# Patient Record
Sex: Male | Born: 1990 | Race: White | Hispanic: No | Marital: Married | Smoking: Never smoker
Health system: Southern US, Community
[De-identification: ages and names within clinical notes are randomized; demographics above are authoritative.]

## PROBLEM LIST (undated history)

## (undated) DIAGNOSIS — G709 Myoneural disorder, unspecified: Secondary | ICD-10-CM

## (undated) DIAGNOSIS — J45909 Unspecified asthma, uncomplicated: Secondary | ICD-10-CM

## (undated) HISTORY — PX: OTHER SURGICAL HISTORY: SHX169

## (undated) HISTORY — DX: Myoneural disorder, unspecified: G70.9

## (undated) HISTORY — PX: BICEPS TENDON REPAIR: SHX566

## (undated) HISTORY — PX: SHOULDER SURGERY: SHX246

## (undated) HISTORY — PX: TONSILLECTOMY: SHX5217

---
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
## 2011-09-19 ENCOUNTER — Emergency Department: Payer: Self-pay | Admitting: Emergency Medicine

## 2011-09-21 ENCOUNTER — Ambulatory Visit: Payer: Self-pay | Admitting: Orthopedic Surgery

## 2013-09-19 ENCOUNTER — Ambulatory Visit: Payer: Self-pay | Admitting: Family Medicine

## 2013-09-26 ENCOUNTER — Ambulatory Visit: Payer: Self-pay | Admitting: Family Medicine

## 2013-12-11 ENCOUNTER — Ambulatory Visit: Payer: Self-pay | Admitting: Physician Assistant

## 2013-12-12 LAB — GC/CHLAMYDIA PROBE AMP

## 2014-01-13 ENCOUNTER — Emergency Department: Payer: Self-pay | Admitting: Internal Medicine

## 2015-01-21 ENCOUNTER — Ambulatory Visit: Payer: BLUE CROSS/BLUE SHIELD

## 2015-01-21 ENCOUNTER — Other Ambulatory Visit: Payer: Self-pay

## 2015-01-21 ENCOUNTER — Encounter: Payer: Self-pay | Admitting: Emergency Medicine

## 2015-01-21 ENCOUNTER — Ambulatory Visit
Admission: EM | Admit: 2015-01-21 | Discharge: 2015-01-21 | Disposition: A | Discharge status: Home / Self Care | Payer: BLUE CROSS/BLUE SHIELD | Attending: Internal Medicine | Admitting: Internal Medicine

## 2015-01-21 DIAGNOSIS — B349 Viral infection, unspecified: Secondary | ICD-10-CM

## 2015-01-21 DIAGNOSIS — R001 Bradycardia, unspecified: Secondary | ICD-10-CM | POA: Insufficient documentation

## 2015-01-21 DIAGNOSIS — R079 Chest pain, unspecified: Secondary | ICD-10-CM | POA: Diagnosis present

## 2015-01-21 HISTORY — DX: Unspecified asthma, uncomplicated: J45.909

## 2015-01-21 MED ORDER — NON FORMULARY
150.0000 mg | Freq: Two times a day (BID) | Status: AC
  Administered 2015-01-21: 150 mg via ORAL

## 2015-01-21 MED ORDER — SALINE SPRAY 0.65 % NA SOLN: 2.0000 | NASAL | Status: DC

## 2015-01-21 MED ORDER — BENZONATATE 200 MG PO CAPS: 200.0000 mg | ORAL_CAPSULE | Freq: Three times a day (TID) | ORAL | Status: DC | PRN

## 2015-01-21 MED ORDER — ALBUTEROL SULFATE HFA 108 (90 BASE) MCG/ACT IN AERS: 1.0000 | INHALATION_SPRAY | RESPIRATORY_TRACT | Status: DC | PRN

## 2015-01-21 MED ORDER — GI COCKTAIL ~~LOC~~
30.0000 mL | Freq: Once | ORAL | Status: AC
  Administered 2015-01-21: 30 mL via ORAL

## 2015-01-21 MED ORDER — NAPROXEN 500 MG PO TABS: 500.0000 mg | ORAL_TABLET | Freq: Two times a day (BID) | ORAL | Status: DC

## 2015-01-21 MED ORDER — FLUTICASONE PROPIONATE 50 MCG/ACT NA SUSP: 1.0000 | Freq: Two times a day (BID) | NASAL | Status: DC

## 2015-01-21 MED ORDER — RANITIDINE HCL 150 MG PO TABS: 150.0000 mg | ORAL_TABLET | Freq: Two times a day (BID) | ORAL | Status: DC

## 2015-01-21 MED ORDER — IPRATROPIUM-ALBUTEROL 0.5-2.5 (3) MG/3ML IN SOLN
3.0000 mL | Freq: Once | RESPIRATORY_TRACT | Status: AC
  Administered 2015-01-21: 3 mL via RESPIRATORY_TRACT

## 2015-01-21 NOTE — ED Notes (Signed)
Patient transported to X-ray 

## 2015-01-21 NOTE — ED Provider Notes (Signed)
CSN: [id number]     Arrival date & time 01/21/15  9562 History   First MD Initiated Contact with Patient 01/21/15 0756     Chief Complaint  Patient presents with  . Chest Pain   (Consider location/radiation/quality/duration/timing/severity/associated sxs/prior Treatment) HPI Comments: Caucasian male awoke 0200 this am with chest pain and headache; denied history of chest pain; noted runny nose, cough, wheezing started Saturday also  Chest pain worsens with cough feels like he should throw up but can't.  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 male presenting with chest pain. The history is provided by the patient.  Chest Pain Pain location:  Epigastric, L chest and R chest Pain quality: burning and tightness   Pain quality: not aching, not crushing, not dull, not hot, no pressure, not radiating, not sharp, not shooting, not stabbing, not tearing and not throbbing   Pain radiates to:  Does not radiate Pain radiates to the back: no   Pain severity:  Moderate Onset quality:  Sudden Duration:  6 hours Timing:  Constant Progression:  Unchanged Chronicity:  New Context: breathing and at rest   Context: no drug use, not eating, no intercourse, not lifting, no movement, not raising an arm, no stress and no trauma   Relieved by:  Nothing Worsened by:  Coughing, exertion, movement and deep breathing Ineffective treatments:  None tried Associated symptoms: cough, headache and nausea   Associated symptoms: no abdominal pain, no AICD problem, no altered mental status, no anorexia, no anxiety, no back pain, no claudication, no diaphoresis, no dizziness, no dysphagia, no fatigue, no fever, no heartburn, no lower extremity edema, no near-syncope, no numbness, no  orthopnea, no palpitations, no PND, no shortness of breath, no syncope, not vomiting and no weakness   Cough:    Cough characteristics:  Non-productive and harsh   Severity:  Moderate   Onset quality:  Sudden   Duration:  3 days   Timing:  Intermittent   Progression:  Unchanged   Chronicity:  New Headaches:    Severity:  Moderate   Onset quality:  Sudden   Duration:  6 hours   Timing:  Constant   Progression:  Unchanged   Chronicity:  New Nausea:    Severity:  Moderate   Onset quality:  Sudden   Duration:  6 hours   Timing:  Constant   Progression:  Unchanged Risk factors: no aortic disease, no birth control, no coronary artery disease, no diabetes mellitus, no Ehlers-Danlos syndrome, no high cholesterol, no hypertension, no immobilization, not male, no Marfan's syndrome, not obese, not pregnant, no prior DVT/PE, no smoking and no surgery     Past Medical History  Diagnosis Date  . Asthma    Past Surgical History  Procedure Laterality Date  . Shoulder surgery Right    History reviewed. No pertinent family history. History  Substance Use Topics  . Smoking status: Never Smoker   . Smokeless tobacco: Former Neurosurgeon    Types: Chew  . Alcohol Use: Yes    Review of Systems  Constitutional: Negative for fever, chills, diaphoresis, activity change, appetite change and fatigue.  HENT: Positive for rhinorrhea and sneezing. Negative for congestion, dental problem, drooling, ear discharge, ear pain, facial swelling, hearing loss, mouth sores,  nosebleeds, postnasal drip, sinus pressure, sore throat, tinnitus, trouble swallowing and voice change.   Eyes: Negative for photophobia, pain, discharge, redness, itching and visual disturbance.  Respiratory: Positive for cough and chest tightness. Negative for choking, shortness of breath, wheezing and stridor.   Cardiovascular: Positive for chest pain. Negative for palpitations, orthopnea, claudication, leg swelling, syncope, PND and  near-syncope.  Gastrointestinal: Positive for nausea. Negative for heartburn, vomiting, abdominal pain, diarrhea, constipation, blood in stool, abdominal distention, anal bleeding, rectal pain and anorexia.  Endocrine: Negative for cold intolerance and heat intolerance.  Genitourinary: Negative for dysuria, hematuria, enuresis, difficulty urinating and genital sores.  Musculoskeletal: Negative for myalgias, back pain, joint swelling, arthralgias, gait problem, neck pain and neck stiffness.  Skin: Negative for color change, pallor, rash and wound.  Allergic/Immunologic: Negative for environmental allergies and food allergies.  Neurological: Positive for headaches. Negative for dizziness, tremors, seizures, syncope, facial asymmetry, speech difficulty, weakness, light-headedness and numbness.  Hematological: Negative for adenopathy. Does not bruise/bleed easily.  Psychiatric/Behavioral: Positive for sleep disturbance. Negative for behavioral problems, confusion and agitation.    Allergies  Review of patient's allergies indicates no known allergies.  Home Medications   Prior to Admission medications   Medication Sig Start Date End Date Taking? Authorizing Provider  albuterol (PROVENTIL HFA;VENTOLIN HFA) 108 (90 BASE) MCG/ACT inhaler Inhale 1-2 puffs into the lungs every 4 (four) hours as needed for wheezing or shortness of breath. 01/21/15   Barbaraann Barthel, NP  benzonatate (TESSALON) 200 MG capsule Take 1 capsule (200 mg total) by mouth 3 (three) times daily as needed for cough. 01/21/15   Barbaraann Barthel, NP  fluticasone (FLONASE) 50 MCG/ACT nasal spray Place 1 spray into both nostrils 2 (two) times daily. 01/21/15   Barbaraann Barthel, NP  naproxen (NAPROSYN) 500 MG tablet Take 1 tablet (500 mg total) by mouth 2 (two) times daily with a meal. 01/21/15   Barbaraann Barthel, NP  ranitidine (ZANTAC) 150 MG tablet Take 1 tablet (150 mg total) by mouth 2 (two) times daily. 01/21/15   Barbaraann Barthel, NP  sodium chloride (OCEAN) 0.65 % SOLN nasal spray Place 2 sprays into both nostrils every 2 (two) hours while awake. 01/21/15   Barbaraann Barthel, NP   BP 116/72 mmHg  Pulse 84  Temp(Src) 96.8 F (36 C) (Tympanic)  Resp 16  Ht 6' (1.829 m)  Wt 190 lb (86.183 kg)  BMI 25.76 kg/m2  SpO2 98% Physical Exam  Constitutional: He is oriented to person, place, and time. Vital signs are normal. He appears well-developed and well-nourished. No distress.  HENT:  Head: Normocephalic and atraumatic.  Right Ear: Hearing, external ear and ear canal normal. Tympanic membrane is scarred. A middle ear effusion is present.  Left Ear: Hearing, external ear and ear canal normal. Tympanic membrane is scarred. A middle ear effusion is present.  Nose: Nose normal.  Mouth/Throat: Uvula is midline. Mucous membranes are not pale, dry and not cyanotic. He does not have dentures. No oral lesions. No trismus in the jaw. Normal dentition. No dental abscesses, uvula swelling, lacerations or dental caries. Posterior oropharyngeal edema and posterior oropharyngeal erythema present. No oropharyngeal exudate or tonsillar abscesses.  Eyes: Conjunctivae, EOM and lids are normal. Pupils are equal, round, and reactive to light. Right eye exhibits no discharge. Left eye exhibits no discharge. No scleral icterus.  Neck: Trachea normal and normal range of motion. Neck supple. No tracheal deviation present. No thyromegaly present.  Cardiovascular: Normal rate,  regular rhythm, normal heart sounds and intact distal pulses.  Exam reveals no gallop and no friction rub.   No murmur heard. Pulmonary/Chest: Effort normal. No accessory muscle usage or stridor. No respiratory distress. He has decreased breath sounds in the right lower field and the left lower field. He has no wheezes. He has no rhonchi. He has no rales. He exhibits no tenderness.  Intermittent nonproductive cough with deep breaths and long sentences  Abdominal:  Soft. Normal appearance, normal aorta and bowel sounds are normal. He exhibits no shifting dullness, no distension, no pulsatile liver, no fluid wave, no abdominal bruit, no ascites, no pulsatile midline mass and no mass. There is no hepatosplenomegaly. There is tenderness in the right lower quadrant and left lower quadrant. There is no rebound, no guarding, no CVA tenderness, no tenderness at McBurney's point and negative Murphy's sign. Hernia confirmed negative in the ventral area.  Dull to percussion x 4 quads  Musculoskeletal: Normal range of motion. He exhibits no edema or tenderness.  Lymphadenopathy:    He has no cervical adenopathy.  Neurological: He is alert and oriented to person, place, and time. He exhibits normal muscle tone. Coordination normal.  Skin: Skin is warm, dry and intact. No rash noted. He is not diaphoretic. No erythema. No pallor.  Psychiatric: He has a normal mood and affect. His speech is normal and behavior is normal. Judgment and thought content normal. Cognition and memory are normal.  Nursing note and vitals reviewed.   ED Course  ED EKG  Date/Time: 01/21/2015 8:23 AM Performed by: Albina Billet A Authorized by: Barbaraann Barthel Interpreted by ED physician Previous ECG: no previous ECG available Rhythm: sinus bradycardia Rate: bradycardic QRS axis: normal ST Segments: ST segments normal T Waves: T waves normal Clinical impression: normal ECG Comments: Vent rate 55 bpm Pr interval QRS duration QT/QTc 408/390 ms PRT axes 25 81 44    (including critical care time) Labs Review Labs Reviewed - No data to display  Imaging Review Dg Chest 2 View  01/21/2015   CLINICAL DATA:  Mid chest pain since this morning.  EXAM: CHEST  2 VIEW  COMPARISON:  04/04/2007  FINDINGS: Normal heart size and mediastinal contours. No acute infiltrate or edema. No effusion or pneumothorax. No acute osseous findings.  IMPRESSION: Negative chest.   Electronically  Signed   By: Marnee Spring M.D.   On: 01/21/2015 09:08   1610  Discussed with patient and father EKG did not show heart attack.  Heart rate was slower than normal (bradycardia)  Patient with frequent nonproductive cough and worsening chest discomfort with cough  Duoneb, GI cocktail ordered  0830 patient reported pain with swallowing resolved and cough resolved after duoneb.  Sp02 99% room air but still having chest discomfort with deep breaths epigastric.  Chest xray discussed and ordered.  Discussed zantac  po with patient and father agreed to trial.  Probable viral illness/inflammation of lining lungs/bronchitis discussed with patient and father.  0940 chest xray results discussed with patient and father.  Given copy of radiology report.  Patient ready to go home  Discomfort improved but not resolved.  Will start naproxen  po BID after picking up from pharmacy this am.  Rest today,  Work excuse not required father is employer.  Hydrate.  Take medications as prescribed  Patient ambulatory without difficulty/dizzyness/SOB/worsening chest pain.  Patient and father verbalized understanding of information/instructions, agreed with plan of care and had no further questions at this  time.    MDM   1. Acute viral syndrome   2. Bradycardia, sinus    Pleurisy, URI, gastroenteritis.  no evidence of invasive bacterial infection, non toxic and well hydrated.  This is most likely self limiting viral infection.  I do not see where any further testing or imaging is necessary at this time.  Chest xray negative effusion/pneumonia/normal discussed with patient and father and given copy of radiology report.  Albuterol 2 puffs po q4-6h prn cough/wheezing.  Use ATC for 48 hours due to inflammation.  Naproxen 500mg  po BID x2 weeks.  Flonase for sinus pressure/rhinitis 1 spray each nostril BID.  Nasal saline 2 sprays each nostril q2h while awake/congestion.  I will suggest supportive care, rest, good hygiene and  encourage the patient to take adequate fluids.  Does not require work excuse.  Notified patient staff will call with culture results once available next 48+ hours.  Sudafed 30mg  po q4-6h prn if rhinitis not controlled with flonase; flonase 1 spray each nostril BID prn, nasal saline 1-2 sprays each nostril prn q2h, tessalon pearles 200mg  po TID prn cough not controlled with inhaler/flonase.  Demonstrated MDI use with patient and father.  Discussed honey with lemon and salt water gargles for comfort also.  The patient is to return to clinic or EMERGENCY ROOM if symptoms worsen or change significantly e.g. fever, lethargy, SOB, wheezing.  Exitcare handout on viral illness, pleurisy, pericarditis, chest pain given to patient.  Patient verbalized agreement and understanding of treatment plan.  I have recommended clear fluids and the BRAT diet.  Zantac 150mg  po BID x 2 weeks.   Medications as directed.  Return to the clinic if  symptoms persist or worsen; I have alerted the patient to call if high fever, unable to urinate every 8 hours, dehydration, marked weakness, fainting, increased abdominal pain, blood in stool or vomit.  Patient verbalized agreement and understanding of treatment plan.   P2:  Hand washing and fitness    Barbaraann Barthelina A Betancourt, NP 01/21/15 1534

## 2015-01-21 NOTE — ED Notes (Signed)
Patient is resting comfortably and father at bedside.

## 2015-01-21 NOTE — Discharge Instructions (Signed)
Bradycardia Bradycardia is a term for a heart rate (pulse) that, in adults, is slower than 60 beats per minute. A normal rate is 60 to 100 beats per minute. A heart rate below 60 beats per minute may be normal for some adults with healthy hearts. If the rate is too slow, the heart may have trouble pumping the volume of blood the body needs. If the heart rate gets too low, blood flow to the brain may be decreased and may make you feel lightheaded, dizzy, or faint. The heart has a natural pacemaker in the top of the heart called the SA node (sinoatrial or sinus node). This pacemaker sends out regular electrical signals to the muscle of the heart, telling the heart muscle when to beat (contract). The electrical signal travels from the upper parts of the heart (atria) through the AV node (atrioventricular node), to the lower chambers of the heart (ventricles). The ventricles squeeze, pumping the blood from your heart to your lungs and to the rest of your body. CAUSES  Problem with the heart's electrical system. Problem with the heart's natural pacemaker. Heart disease, damage, or infection. Medications. Problems with minerals and salts (electrolytes). SYMPTOMS  Fainting (syncope). Fatigue and weakness. Shortness of breath (dyspnea). Chest pain (angina). Drowsiness. Confusion. DIAGNOSIS  An electrocardiogram (ECG) can help your caregiver determine the type of slow heart rate you have. If the cause is not seen on an ECG, you may need to wear a heart monitor that records your heart rhythm for several hours or days. Blood tests. TREATMENT  Electrolyte supplements. Medications. Withholding medication which is causing a slow heart rate. Pacemaker placement. SEEK IMMEDIATE MEDICAL CARE IF:  You feel lightheaded or faint. You develop an irregular heart rate. You feel chest pain or have trouble breathing. MAKE SURE YOU:  Understand these instructions. Will watch your condition. Will get help right  away if you are not doing well or get worse. Document Released: 03/14/2002 Document Revised: 09/14/2011 Document Reviewed: 09/27/2013 [facility] Patient Information 2015 [place], [place]. This information is not intended to replace advice given to you by your health care provider. Make sure you discuss any questions you have with your health care provider. Chest Pain (Nonspecific) It is often hard to give a specific diagnosis for the cause of chest pain. There is always a chance that your pain could be related to something serious, such as a heart attack or a blood clot in the lungs. You need to follow up with your health care provider for further evaluation. CAUSES  Heartburn. Pneumonia or bronchitis. Anxiety or stress. Inflammation around your heart (pericarditis) or lung (pleuritis or pleurisy). A blood clot in the lung. A collapsed lung (pneumothorax). It can develop suddenly on its own (spontaneous pneumothorax) or from trauma to the chest. Shingles infection (herpes zoster virus). The chest wall is composed of bones, muscles, and cartilage. Any of these can be the source of the pain. The bones can be bruised by injury. The muscles or cartilage can be strained by coughing or overwork. The cartilage can be affected by inflammation and become sore (costochondritis). DIAGNOSIS  Lab tests or other studies may be needed to find the cause of your pain. Your health care provider may have you take a test called an ambulatory electrocardiogram (ECG). An ECG records your heartbeat patterns over a 24-hour period. You may also have other tests, such as: Transthoracic echocardiogram (TTE). During echocardiography, sound waves are used to evaluate how blood flows through your heart. Transesophageal echocardiogram (  TEE). Cardiac monitoring. This allows your health care provider to monitor your heart rate and rhythm in real time. Holter monitor. This is a portable device that records your heartbeat and can  help diagnose heart arrhythmias. It allows your health care provider to track your heart activity for several days, if needed. Stress tests by exercise or by giving medicine that makes the heart beat faster. TREATMENT  Treatment depends on what may be causing your chest pain. Treatment may include: Acid blockers for heartburn. Anti-inflammatory medicine. Pain medicine for inflammatory conditions. Antibiotics if an infection is present. You may be advised to change lifestyle habits. This includes stopping smoking and avoiding alcohol, caffeine, and chocolate. You may be advised to keep your head raised (elevated) when sleeping. This reduces the chance of acid going backward from your stomach into your esophagus. Most of the time, nonspecific chest pain will improve within 2-3 days with rest and mild pain medicine.  HOME CARE INSTRUCTIONS  If antibiotics were prescribed, take them as directed. Finish them even if you start to feel better. For the next few days, avoid physical activities that bring on chest pain. Continue physical activities as directed. Do not use any tobacco products, including cigarettes, chewing tobacco, or electronic cigarettes. Avoid drinking alcohol. Only take medicine as directed by your health care provider. Follow your health care provider's suggestions for further testing if your chest pain does not go away. Keep any follow-up appointments you made. If you do not go to an appointment, you could develop lasting (chronic) problems with pain. If there is any problem keeping an appointment, call to reschedule. SEEK MEDICAL CARE IF:  Your chest pain does not go away, even after treatment. You have a rash with blisters on your chest. You have a fever. SEEK IMMEDIATE MEDICAL CARE IF:  You have increased chest pain or pain that spreads to your arm, neck, jaw, back, or abdomen. You have shortness of breath. You have an increasing cough, or you cough up blood. You have  severe back or abdominal pain. You feel nauseous or vomit. You have severe weakness. You faint. You have chills. This is an emergency. Do not wait to see if the pain will go away. Get medical help at once. Call your local emergency services (911 in [place] Do not drive yourself to the hospital. MAKE SURE YOU:  Understand these instructions. Will watch your condition. Will get help right away if you are not doing well or get worse. Document Released: 04/01/2005 Document Revised: 06/27/2013 Document Reviewed: 01/26/2008 [facility] Patient Information 2015 [place], [place]. This information is not intended to replace advice given to you by your health care provider. Make sure you discuss any questions you have with your health care provider. Viral Infections A virus is a type of germ. Viruses can cause: Minor sore throats. Aches and pains. Headaches. Runny nose. Rashes. Watery eyes. Tiredness. Coughs. Loss of appetite. Feeling sick to your stomach (nausea). Throwing up (vomiting). Watery poop (diarrhea). HOME CARE  Only take medicines as told by your doctor. Drink enough water and fluids to keep your pee (urine) clear or pale yellow. Sports drinks are a good choice. Get plenty of rest and eat healthy. Soups and broths with crackers or rice are fine. GET HELP RIGHT AWAY IF:  You have a very bad headache. You have shortness of breath. You have chest pain or neck pain. You have an unusual rash. You cannot stop throwing up. You have watery poop that does not stop. You  cannot keep fluids down. You or your child has a temperature by mouth above 102 F (38.9 C), not controlled by medicine. Your baby is older than 3 months with a rectal temperature of 102 F (38.9 C) or higher. Your baby is 343 months old or younger with a rectal temperature of 100.4 F (38 C) or higher. MAKE SURE YOU:  Understand these instructions. Will watch this condition. Will get help right away if you are not  doing well or get worse. Document Released: 06/04/2008 Document Revised: 09/14/2011 Document Reviewed: 10/28/2010 [facility] Patient [facility] 2015 [place]. This information is not intended to replace advice given to you by your health care provider. Make sure you discuss any questions you have with your health care provider.  Acute Bronchitis Bronchitis is inflammation of the airways that extend from the windpipe into the lungs (bronchi). The inflammation often causes mucus to develop. This leads to a cough, which is the most common symptom of bronchitis.  In acute bronchitis, the condition usually develops suddenly and goes away over time, usually in a couple weeks. Smoking, allergies, and asthma can make bronchitis worse. Repeated episodes of bronchitis may cause further lung problems.  CAUSES Acute bronchitis is most often caused by the same virus that causes a cold. The virus can spread from person to person (contagious) through coughing, sneezing, and touching contaminated objects. SIGNS AND SYMPTOMS   Cough.   Fever.   Coughing up mucus.   Body aches.   Chest congestion.   Chills.   Shortness of breath.   Sore throat.  DIAGNOSIS  Acute bronchitis is usually diagnosed through a physical exam. Your health care provider will also ask you questions about your medical history. Tests, such as chest X-rays, are sometimes done to rule out other conditions.  TREATMENT  Acute bronchitis usually goes away in a couple weeks. Oftentimes, no medical treatment is necessary. Medicines are sometimes given for relief of fever or cough. Antibiotic medicines are usually not needed but may be prescribed in certain situations. In some cases, an inhaler may be recommended to help reduce shortness of breath and control the cough. A cool mist vaporizer may also be used to help thin bronchial secretions and make it easier to clear the chest.  HOME CARE INSTRUCTIONS  Get plenty of rest.    Drink enough fluids to keep your urine clear or pale yellow (unless you have a medical condition that requires fluid restriction). Increasing fluids may help thin your respiratory secretions (sputum) and reduce chest congestion, and it will prevent dehydration.   Take medicines only as directed by your health care provider.  If you were prescribed an antibiotic medicine, finish it all even if you start to feel better.  Avoid smoking and secondhand smoke. Exposure to cigarette smoke or irritating chemicals will make bronchitis worse. If you are a smoker, consider using nicotine gum or skin patches to help control withdrawal symptoms. Quitting smoking will help your lungs heal faster.   Reduce the chances of another bout of acute bronchitis by washing your hands frequently, avoiding people with cold symptoms, and trying not to touch your hands to your mouth, nose, or eyes.   Keep all follow-up visits as directed by your health care provider.  SEEK MEDICAL CARE IF: Your symptoms do not improve after 1 week of treatment.  SEEK IMMEDIATE MEDICAL CARE IF:  You develop an increased fever or chills.   You have chest pain.   You have severe shortness of breath.  You have bloody sputum.   You develop dehydration.  You faint or repeatedly feel like you are going to pass out.  You develop repeated vomiting.  You develop a severe headache. MAKE SURE YOU:   Understand these instructions.  Will watch your condition.  Will get help right away if you are not doing well or get worse. Document Released: 07/30/2004 Document Revised: 11/06/2013 Document Reviewed: 12/13/2012 [facility] Patient Information 2015 [place], [place]. This information is not intended to replace advice given to you by your health care provider. Make sure you discuss any questions you have with your health care provider. Cough, Adult  A cough is a reflex that helps clear your throat and airways. It can help heal  the body or may be a reaction to an irritated airway. A cough may only last 2 or 3 weeks (acute) or may last more than 8 weeks (chronic).  CAUSES Acute cough:  Viral or bacterial infections. Chronic cough:  Infections.  Allergies.  Asthma.  Post-nasal drip.  Smoking.  Heartburn or acid reflux.  Some medicines.  Chronic lung problems (COPD).  Cancer. SYMPTOMS   Cough.  Fever.  Chest pain.  Increased breathing rate.  High-pitched whistling sound when breathing (wheezing).  Colored mucus that you cough up (sputum). TREATMENT   A bacterial cough may be treated with antibiotic medicine.  A viral cough must run its course and will not respond to antibiotics.  Your caregiver may recommend other treatments if you have a chronic cough. HOME CARE INSTRUCTIONS   Only take over-the-counter or prescription medicines for pain, discomfort, or fever as directed by your caregiver. Use cough suppressants only as directed by your caregiver.  Use a cold steam vaporizer or humidifier in your bedroom or home to help loosen secretions.  Sleep in a semi-upright position if your cough is worse at night.  Rest as needed.  Stop smoking if you smoke. SEEK IMMEDIATE MEDICAL CARE IF:   You have pus in your sputum.  Your cough starts to worsen.  You cannot control your cough with suppressants and are losing sleep.  You begin coughing up blood.  You have difficulty breathing.  You develop pain which is getting worse or is uncontrolled with medicine.  You have a fever. MAKE SURE YOU:   Understand these instructions.  Will watch your condition.  Will get help right away if you are not doing well or get worse. Document Released: 12/19/2010 Document Revised: 09/14/2011 Document Reviewed: 12/19/2010 [facility] Patient Information 2015 [place], [place]. This information is not intended to replace advice given to you by your health care provider. Make sure you discuss any  questions you have with your health care provider. Gastroesophageal Reflux Disease, Adult Gastroesophageal reflux disease (GERD) happens when acid from your stomach flows up into the esophagus. When acid comes in contact with the esophagus, the acid causes soreness (inflammation) in the esophagus. Over time, GERD may create small holes (ulcers) in the lining of the esophagus. CAUSES   Increased body weight. This puts pressure on the stomach, making acid rise from the stomach into the esophagus.  Smoking. This increases acid production in the stomach.  Drinking alcohol. This causes decreased pressure in the lower esophageal sphincter (valve or ring of muscle between the esophagus and stomach), allowing acid from the stomach into the esophagus.  Late evening meals and a full stomach. This increases pressure and acid production in the stomach.  A malformed lower esophageal sphincter. Sometimes, no cause is found. SYMPTOMS  Burning pain in the lower part of the mid-chest behind the breastbone and in the mid-stomach area. This may occur twice a week or more often.  Trouble swallowing.  Sore throat.  Dry cough.  Asthma-like symptoms including chest tightness, shortness of breath, or wheezing. DIAGNOSIS  Your caregiver may be able to diagnose GERD based on your symptoms. In some cases, X-rays and other tests may be done to check for complications or to check the condition of your stomach and esophagus. TREATMENT  Your caregiver may recommend over-the-counter or prescription medicines to help decrease acid production. Ask your caregiver before starting or adding any new medicines.  HOME CARE INSTRUCTIONS   Change the factors that you can control. Ask your caregiver for guidance concerning weight loss, quitting smoking, and alcohol consumption.  Avoid foods and drinks that make your symptoms worse, such as:  Caffeine or alcoholic drinks.  Chocolate.  Peppermint or mint  flavorings.  Garlic and onions.  Spicy foods.  Citrus fruits, such as oranges, lemons, or limes.  Tomato-based foods such as sauce, chili, salsa, and pizza.  Fried and fatty foods.  Avoid lying down for the 3 hours prior to your bedtime or prior to taking a nap.  Eat small, frequent meals instead of large meals.  Wear loose-fitting clothing. Do not wear anything tight around your waist that causes pressure on your stomach.  Raise the head of your bed 6 to 8 inches with wood blocks to help you sleep. Extra pillows will not help.  Only take over-the-counter or prescription medicines for pain, discomfort, or fever as directed by your caregiver.  Do not take aspirin, ibuprofen, or other nonsteroidal anti-inflammatory drugs (NSAIDs). SEEK IMMEDIATE MEDICAL CARE IF:   You have pain in your arms, neck, jaw, teeth, or back.  Your pain increases or changes in intensity or duration.  You develop nausea, vomiting, or sweating (diaphoresis).  You develop shortness of breath, or you faint.  Your vomit is green, yellow, black, or looks like coffee grounds or blood.  Your stool is red, bloody, or black. These symptoms could be signs of other problems, such as heart disease, gastric bleeding, or esophageal bleeding. MAKE SURE YOU:   Understand these instructions.  Will watch your condition.  Will get help right away if you are not doing well or get worse. Document Released: 04/01/2005 Document Revised: 09/14/2011 Document Reviewed: 01/09/2011 [facility] Patient Information 2015 [place], [place]. This information is not intended to replace advice given to you by your health care provider. Make sure you discuss any questions you have with your health care provider. Sinusitis Sinusitis is redness, soreness, and inflammation of the paranasal sinuses. Paranasal sinuses are air pockets within the bones of your face (beneath the eyes, the middle of the forehead, or above the eyes). In healthy  paranasal sinuses, mucus is able to drain out, and air is able to circulate through them by way of your nose. However, when your paranasal sinuses are inflamed, mucus and air can become trapped. This can allow bacteria and other germs to grow and cause infection. Sinusitis can develop quickly and last only a short time (acute) or continue over a long period (chronic). Sinusitis that lasts for more than 12 weeks is considered chronic.  CAUSES  Causes of sinusitis include:  Allergies.  Structural abnormalities, such as displacement of the cartilage that separates your nostrils (deviated septum), which can decrease the air flow through your nose and sinuses and affect sinus drainage.  Functional abnormalities, such as  when the small hairs (cilia) that line your sinuses and help remove mucus do not work properly or are not present. SIGNS AND SYMPTOMS  Symptoms of acute and chronic sinusitis are the same. The primary symptoms are pain and pressure around the affected sinuses. Other symptoms include:  Upper toothache.  Earache.  Headache.  Bad breath.  Decreased sense of smell and taste.  A cough, which worsens when you are lying flat.  Fatigue.  Fever.  Thick drainage from your nose, which often is green and may contain pus (purulent).  Swelling and warmth over the affected sinuses. DIAGNOSIS  Your health care provider will perform a physical exam. During the exam, your health care provider may:  Look in your nose for signs of abnormal growths in your nostrils (nasal polyps).  Tap over the affected sinus to check for signs of infection.  View the inside of your sinuses (endoscopy) using an imaging device that has a light attached (endoscope). If your health care provider suspects that you have chronic sinusitis, one or more of the following tests may be recommended:  Allergy tests.  Nasal culture. A sample of mucus is taken from your nose, sent to a lab, and screened for  bacteria.  Nasal cytology. A sample of mucus is taken from your nose and examined by your health care provider to determine if your sinusitis is related to an allergy. TREATMENT  Most cases of acute sinusitis are related to a viral infection and will resolve on their own within 10 days. Sometimes medicines are prescribed to help relieve symptoms (pain medicine, decongestants, nasal steroid sprays, or saline sprays).  However, for sinusitis related to a bacterial infection, your health care provider will prescribe antibiotic medicines. These are medicines that will help kill the bacteria causing the infection.  Rarely, sinusitis is caused by a fungal infection. In theses cases, your health care provider will prescribe antifungal medicine. For some cases of chronic sinusitis, surgery is needed. Generally, these are cases in which sinusitis recurs more than 3 times per year, despite other treatments. HOME CARE INSTRUCTIONS   Drink plenty of water. Water helps thin the mucus so your sinuses can drain more easily.  Use a humidifier.  Inhale steam 3 to 4 times a day (for example, sit in the bathroom with the shower running).  Apply a warm, moist washcloth to your face 3 to 4 times a day, or as directed by your health care provider.  Use saline nasal sprays to help moisten and clean your sinuses.  Take medicines only as directed by your health care provider.  If you were prescribed either an antibiotic or antifungal medicine, finish it all even if you start to feel better. SEEK IMMEDIATE MEDICAL CARE IF:  You have increasing pain or severe headaches.  You have nausea, vomiting, or drowsiness.  You have swelling around your face.  You have vision problems.  You have a stiff neck.  You have difficulty breathing. MAKE SURE YOU:   Understand these instructions.  Will watch your condition.  Will get help right away if you are not doing well or get worse. Document Released: 06/22/2005  Document Revised: 11/06/2013 Document Reviewed: 07/07/2011 [facility] Patient Information 2015 [place], [place]. This information is not intended to replace advice given to you by your health care provider. Make sure you discuss any questions you have with your health care provider. Viral Gastroenteritis Viral gastroenteritis is also known as stomach flu. This condition affects the stomach and intestinal tract. It  can cause sudden diarrhea and vomiting. The illness typically lasts 3 to 8 days. Most people develop an immune response that eventually gets rid of the virus. While this natural response develops, the virus can make you quite ill. CAUSES  Many different viruses can cause gastroenteritis, such as rotavirus or noroviruses. You can catch one of these viruses by consuming contaminated food or water. You may also catch a virus by sharing utensils or other personal items with an infected person or by touching a contaminated surface. SYMPTOMS  The most common symptoms are diarrhea and vomiting. These problems can cause a severe loss of body fluids (dehydration) and a body salt (electrolyte) imbalance. Other symptoms may include:  Fever.  Headache.  Fatigue.  Abdominal pain. DIAGNOSIS  Your caregiver can usually diagnose viral gastroenteritis based on your symptoms and a physical exam. A stool sample may also be taken to test for the presence of viruses or other infections. TREATMENT  This illness typically goes away on its own. Treatments are aimed at rehydration. The most serious cases of viral gastroenteritis involve vomiting so severely that you are not able to keep fluids down. In these cases, fluids must be given through an intravenous line (IV). HOME CARE INSTRUCTIONS   Drink enough fluids to keep your urine clear or pale yellow. Drink small amounts of fluids frequently and increase the amounts as tolerated.  Ask your caregiver for specific rehydration instructions.  Avoid:  Foods  high in sugar.  Alcohol.  Carbonated drinks.  Tobacco.  Juice.  Caffeine drinks.  Extremely hot or cold fluids.  Fatty, greasy foods.  Too much intake of anything at one time.  Dairy products until 24 to 48 hours after diarrhea stops.  You may consume probiotics. Probiotics are active cultures of beneficial bacteria. They may lessen the amount and number of diarrheal stools in adults. Probiotics can be found in yogurt with active cultures and in supplements.  Wash your hands well to avoid spreading the virus.  Only take over-the-counter or prescription medicines for pain, discomfort, or fever as directed by your caregiver. Do not give aspirin to children. Antidiarrheal medicines are not recommended.  Ask your caregiver if you should continue to take your regular prescribed and over-the-counter medicines.  Keep all follow-up appointments as directed by your caregiver. SEEK IMMEDIATE MEDICAL CARE IF:   You are unable to keep fluids down.  You do not urinate at least once every 6 to 8 hours.  You develop shortness of breath.  You notice blood in your stool or vomit. This may look like coffee grounds.  You have abdominal pain that increases or is concentrated in one small area (localized).  You have persistent vomiting or diarrhea.  You have a fever.  The patient is a child younger than 3 months, and he or she has a fever.  The patient is a child older than 3 months, and he or she has a fever and persistent symptoms.  The patient is a child older than 3 months, and he or she has a fever and symptoms suddenly get worse.  The patient is a baby, and he or she has no tears when crying. MAKE SURE YOU:   Understand these instructions.  Will watch your condition.  Will get help right away if you are not doing well or get worse. Document Released: 06/22/2005 Document Revised: 09/14/2011 Document Reviewed: 04/08/2011 [facility] Patient Information 2015 [place], [place].  This information is not intended to replace advice given to you by  your health care provider. Make sure you discuss any questions you have with your health care provider. Pericarditis Pericarditis is swelling (inflammation) of the pericardium. The pericardium is a thin, double-layered, fluid-filled tissue sac that surrounds the heart. The purpose of the pericardium is to contain the heart in the chest cavity and keep the heart from overexpanding. Different types of pericarditis can occur, such as:  Acute pericarditis. Inflammation can develop suddenly in acute pericarditis.  Chronic pericarditis. Inflammation develops gradually and is long-lasting in chronic pericarditis.  Constrictive pericarditis. In this type of pericarditis, the layers of the pericardium stiffen and develop scar tissue. The scar tissue thickens and sticks together. This makes it difficult for the heart to pump and work as it normally does. CAUSES  Pericarditis can be caused from different conditions, such as:  A bacterial, fungal or viral infection.  After a heart attack (myocardial infarction).  After open-heart surgery (coronary bypass graft surgery).  Auto-immune conditions such as lupus, rheumatoid arthritis or scleroderma.  Kidney failure.  Low thyroid condition (hypothyroidism).  Cancer from another part of the body that has spread (metastasized) to the pericardium.  Chest injury or trauma.  After radiation treatment.  Certain medicines. SYMPTOMS  Symptoms of pericarditis can include:  Chest pain. Chest pain symptoms may increase when laying down and may be relieved when sitting up and leaning forward.  A chronic, dry cough.  Heart palpitations. These may feel like rapid, fluttering or pounding heart beats.  Chest pain may be worse when swallowing.  Dizziness or fainting.  Tiredness, fatigue or lethargy.  Fever. DIAGNOSIS  Pericarditis is diagnosed by the following:  A physical exam. A  heart sound called a pericardial friction rub may be heard when your caregiver listens to your heart.  Blood work. Blood may be drawn to check for an infection and to look at your blood chemistry.  Electrocardiography. During electrocardiography your heart's electrical activity is monitored and recorded with a tracing on paper (electrocardiogram [ECG]).  Echocardiography.  Computed tomography (CT).  Magnetic resonance image (MRI). TREATMENT  To treat pericarditis, it is important to know the cause of it. The cause of pericarditis determines the treatment.   If the cause of pericarditis is due to an infection, treatment is based on the type of infection. If an infection is suspected in the pericardial fluid, a procedure called a pericardial fluid culture and biopsy may be done. This takes a sample of the pericardial fluid. The sample is sent to a lab which runs tests on the pericardial fluid to check for an infection.  If the autoimmune disease is the cause, treatment of the autoimmune condition will help improve the pericarditis.  If the cause of pericarditis is not known, anti-inflammatory medicines may be used to help decrease the inflammation.  Surgery may be needed. The following are types of surgeries or procedures that may be done to treat pericarditis:  Pericardial window. A pericardial window makes a cut (incision) into the pericardial sac. This allows excess fluid in the pericardium to drain.  Pericardiocentesis. A pericardiocentesis is also known as a pericardial tap. This procedure uses a needle that is guided by X-ray to drain (aspirate) excess fluid from the pericardium.  Pericardiectomy. A pericardiectomy removes part or all of the pericardium. HOME CARE INSTRUCTIONS   Do not smoke. If you smoke, quit. Your caregiver can help you quit smoking.  Maintain a healthy weight.  Follow an exercise program as told by your caregiver.  If you drink alcohol, do  so in  moderation.  Eat a heart healthy diet. A registered dietician can help you learn about healthy food choices.  Keep a list of all your medicines with you at all times. Include the name, dose, how often it is taken and how it is taken. SEEK IMMEDIATE MEDICAL CARE IF:   You have chest pain or feelings of chest pressure.  You have sweating (diaphoresis) when at rest.  You have irregular heartbeats (palpitations).  You have rapid, racing heart beats.  You have unexplained fainting episodes.  You feel sick to your stomach (nausea) or vomiting without cause.  You have unexplained weakness. If you develop any of the symptoms which originally made you seek care, call for local emergency medical help. Do not drive yourself to the hospital. Document Released: 12/16/2000 Document Revised: 09/14/2011 Document Reviewed: 06/24/2011 [facility] Patient Information 2015 [place], [place]. This information is not intended to replace advice given to you by your health care provider. Make sure you discuss any questions you have with your health care provider. Pleurisy Pleurisy is an inflammation and swelling of the lining of the lungs (pleura). Because of this inflammation, it hurts to breathe. It can be aggravated by coughing, laughing, or deep breathing. Pleurisy is often caused by an underlying infection or disease.  HOME CARE INSTRUCTIONS  Monitor your pleurisy for any changes. The following actions may help to alleviate any discomfort you are experiencing:  Medicine may help with pain. Only take over-the-counter or prescription medicines for pain, discomfort, or fever as directed by your health care provider.  Only take antibiotic medicine as directed. Make sure to finish it even if you start to feel better. SEEK MEDICAL CARE IF:   Your pain is not controlled with medicine or is increasing.  You have an increase in pus-like (purulent) secretions brought up with coughing. SEEK IMMEDIATE MEDICAL CARE  IF:   You have blue or dark lips, fingernails, or toenails.  You are coughing up blood.  You have increased difficulty breathing.  You have continuing pain unrelieved by medicine or pain lasting more than 1 week.  You have pain that radiates into your neck, arms, or jaw.  You develop increased shortness of breath or wheezing.  You develop a fever, rash, vomiting, fainting, or other serious symptoms. MAKE SURE YOU:  Understand these instructions.   Will watch your condition.   Will get help right away if you are not doing well or get worse.  Document Released: 06/22/2005 Document Revised: 02/22/2013 Document Reviewed: 12/04/2012 [facility] Patient Information 2015 [place], [place]. This information is not intended to replace advice given to you by your health care provider. Make sure you discuss any questions you have with your health care provider.

## 2015-01-21 NOTE — ED Notes (Signed)
Patient c/o SOB and chest pain that started at 2:00am this morning.  Patient denies vomiting.

## 2015-09-24 ENCOUNTER — Encounter: Payer: Self-pay | Admitting: Family Medicine

## 2015-09-24 ENCOUNTER — Ambulatory Visit (INDEPENDENT_AMBULATORY_CARE_PROVIDER_SITE_OTHER): Payer: BLUE CROSS/BLUE SHIELD | Admitting: Family Medicine

## 2015-09-24 VITALS — BP 120/80 | HR 78 | Ht 74.0 in | Wt 173.0 lb

## 2015-09-24 DIAGNOSIS — R634 Abnormal weight loss: Secondary | ICD-10-CM

## 2015-09-24 DIAGNOSIS — B009 Herpesviral infection, unspecified: Secondary | ICD-10-CM | POA: Diagnosis not present

## 2015-09-24 LAB — GLUCOSE, POCT (MANUAL RESULT ENTRY): POC GLUCOSE: 85 mg/dL (ref 70–99)

## 2015-09-24 MED ORDER — VALACYCLOVIR HCL 500 MG PO TABS: 500.0000 mg | ORAL_TABLET | Freq: Two times a day (BID) | ORAL | Status: DC

## 2015-09-24 MED ORDER — VALACYCLOVIR HCL 500 MG PO TABS: 500.0000 mg | ORAL_TABLET | Freq: Every day | ORAL | Status: DC

## 2015-09-24 NOTE — Progress Notes (Signed)
Name: Shaun Gibbs   MRN: [id number]    DOB: [birth year]   Date:09/24/2015       Progress Note  Subjective  Chief Complaint  Chief Complaint  Patient presents with  . Establish Care  . Rash    around penis- has had herpes in past    Rash This is a recurrent problem. The current episode started today. The problem has been waxing and waning since onset. The affected locations include the genitalia. The rash is characterized by redness (vesicles). Pertinent negatives include no anorexia, congestion, cough, diarrhea, eye pain, facial edema, fatigue, fever, joint pain, nail changes, rhinorrhea, shortness of breath, sore throat or vomiting. The treatment provided no relief.    No problem-specific assessment & plan notes found for this encounter.   Past Medical History  Diagnosis Date  . Asthma     Past Surgical History  Procedure Laterality Date  . Biceps tendon repair Right   . Thumb tendon      repair  . Tonsillectomy    . Nose fracture repair      History reviewed. No pertinent family history.  Social History   Social History  . Marital Status: Unknown    Spouse Name: N/A  . Number of Children: N/A  . Years of Education: N/A   Occupational History  . Not on file.   Social History Main Topics  . Smoking status: Never Smoker   . Smokeless tobacco: Current User    Types: Snuff  . Alcohol Use: 0.0 oz/week    0 Standard drinks or equivalent per week  . Drug Use: No  . Sexual Activity: Yes   Other Topics Concern  . Not on file   Social History Narrative  . No narrative on file    No Known Allergies   Review of Systems  Constitutional: Negative for fever, chills, weight loss, malaise/fatigue and fatigue.  HENT: Negative for congestion, ear discharge, ear pain, rhinorrhea and sore throat.   Eyes: Negative for blurred vision and pain.  Respiratory: Negative for cough, sputum production, shortness of breath and wheezing.   Cardiovascular: Negative for  chest pain, palpitations and leg swelling.  Gastrointestinal: Negative for heartburn, nausea, vomiting, abdominal pain, diarrhea, constipation, blood in stool, melena and anorexia.  Genitourinary: Negative for dysuria, urgency, frequency and hematuria.  Musculoskeletal: Negative for myalgias, back pain, joint pain and neck pain.  Skin: Positive for rash. Negative for nail changes.  Neurological: Negative for dizziness, tingling, sensory change, focal weakness and headaches.  Endo/Heme/Allergies: Negative for environmental allergies and polydipsia. Does not bruise/bleed easily.  Psychiatric/Behavioral: Negative for depression and suicidal ideas. The patient is not nervous/anxious and does not have insomnia.      Objective  Filed Vitals:   09/24/15 1355  BP: 120/80  Pulse: 78  Height:  (1.88 m)  Weight: 173 lb (78.472 kg)    Physical Exam  Constitutional: He is oriented to person, place, and time and well-developed, well-nourished, and in no distress.  HENT:  Head: Normocephalic.  Right Ear: External ear normal.  Left Ear: External ear normal.  Nose: Nose normal.  Mouth/Throat: Oropharynx is clear and moist.  Eyes: Conjunctivae and EOM are normal. Pupils are equal, round, and reactive to light. Right eye exhibits no discharge. Left eye exhibits no discharge. No scleral icterus.  Neck: Normal range of motion. Neck supple. No JVD present. No tracheal deviation present. No thyromegaly present.  Cardiovascular: Normal rate, regular rhythm, normal heart sounds and intact distal pulses.  Exam reveals no gallop and no friction rub.   No murmur heard. Pulmonary/Chest: Breath sounds normal. No respiratory distress. He has no wheezes. He has no rales.  Abdominal: Soft. Bowel sounds are normal. He exhibits no mass. There is no hepatosplenomegaly. There is no tenderness. There is no rebound, no guarding and no CVA tenderness.  Genitourinary: He exhibits testicular tenderness. He exhibits no  abnormal testicular mass, no abnormal scrotal mass and no scrotal tenderness. Penis exhibits lesions.  Musculoskeletal: Normal range of motion. He exhibits no edema or tenderness.  Lymphadenopathy:    He has no cervical adenopathy.  Neurological: He is alert and oriented to person, place, and time. He has normal sensation, normal strength and intact cranial nerves. No cranial nerve deficit.  Skin: Skin is warm. No rash noted.  Psychiatric: Mood and affect normal.      Assessment & Plan  Problem List Items Addressed This Visit    None    Visit Diagnoses    Herpes simplex    -  Primary    Relevant Medications    valACYclovir (VALTREX) 500 MG tablet    valACYclovir (VALTREX) 500 MG tablet    Weight loss, unintentional        Relevant Orders    POCT Glucose (CBG) (Completed)         Dr. Hayden Rasmusseneanna Josmar Messimer [facility]  09/24/2015

## 2016-03-24 IMAGING — CR DG CHEST 2V
2 series · 2 of 2 positions shown · non-contrast
Comparison: 04/04/2007

CLINICAL DATA: Mid chest pain since this morning.

EXAM:
CHEST  2 VIEW

[chest pa]
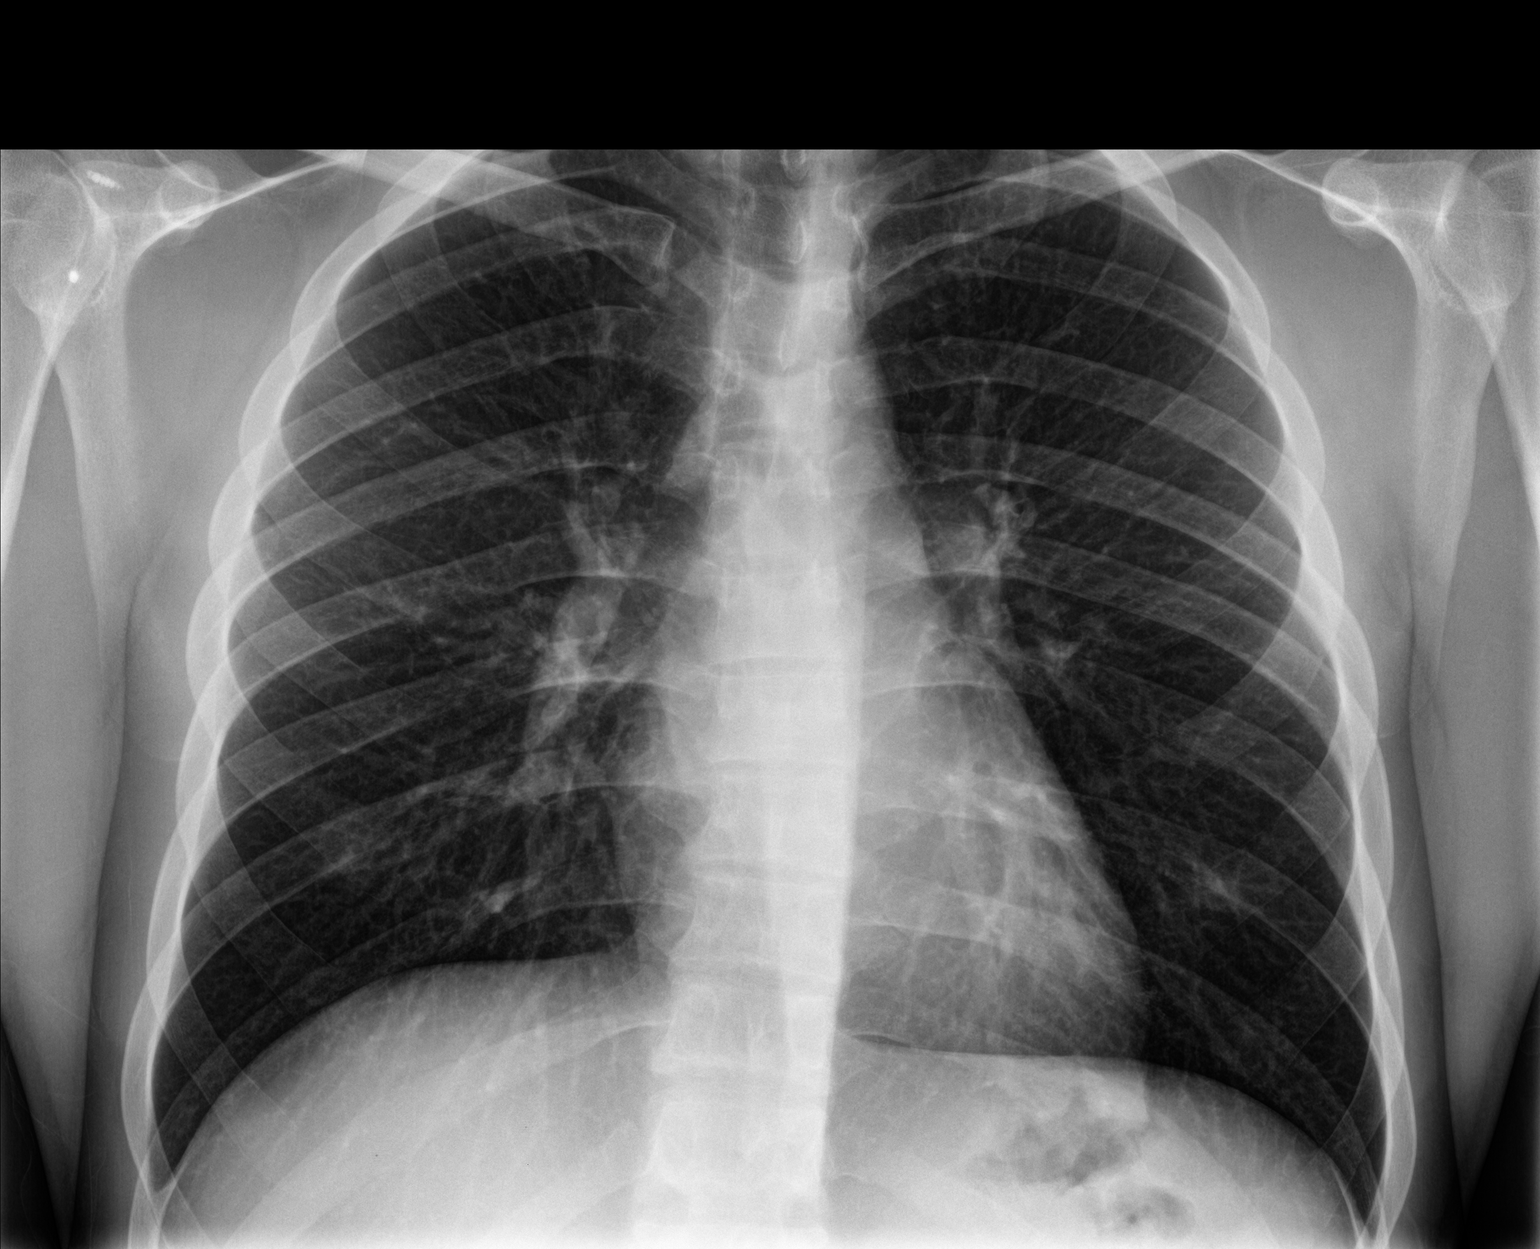

[chest lat]
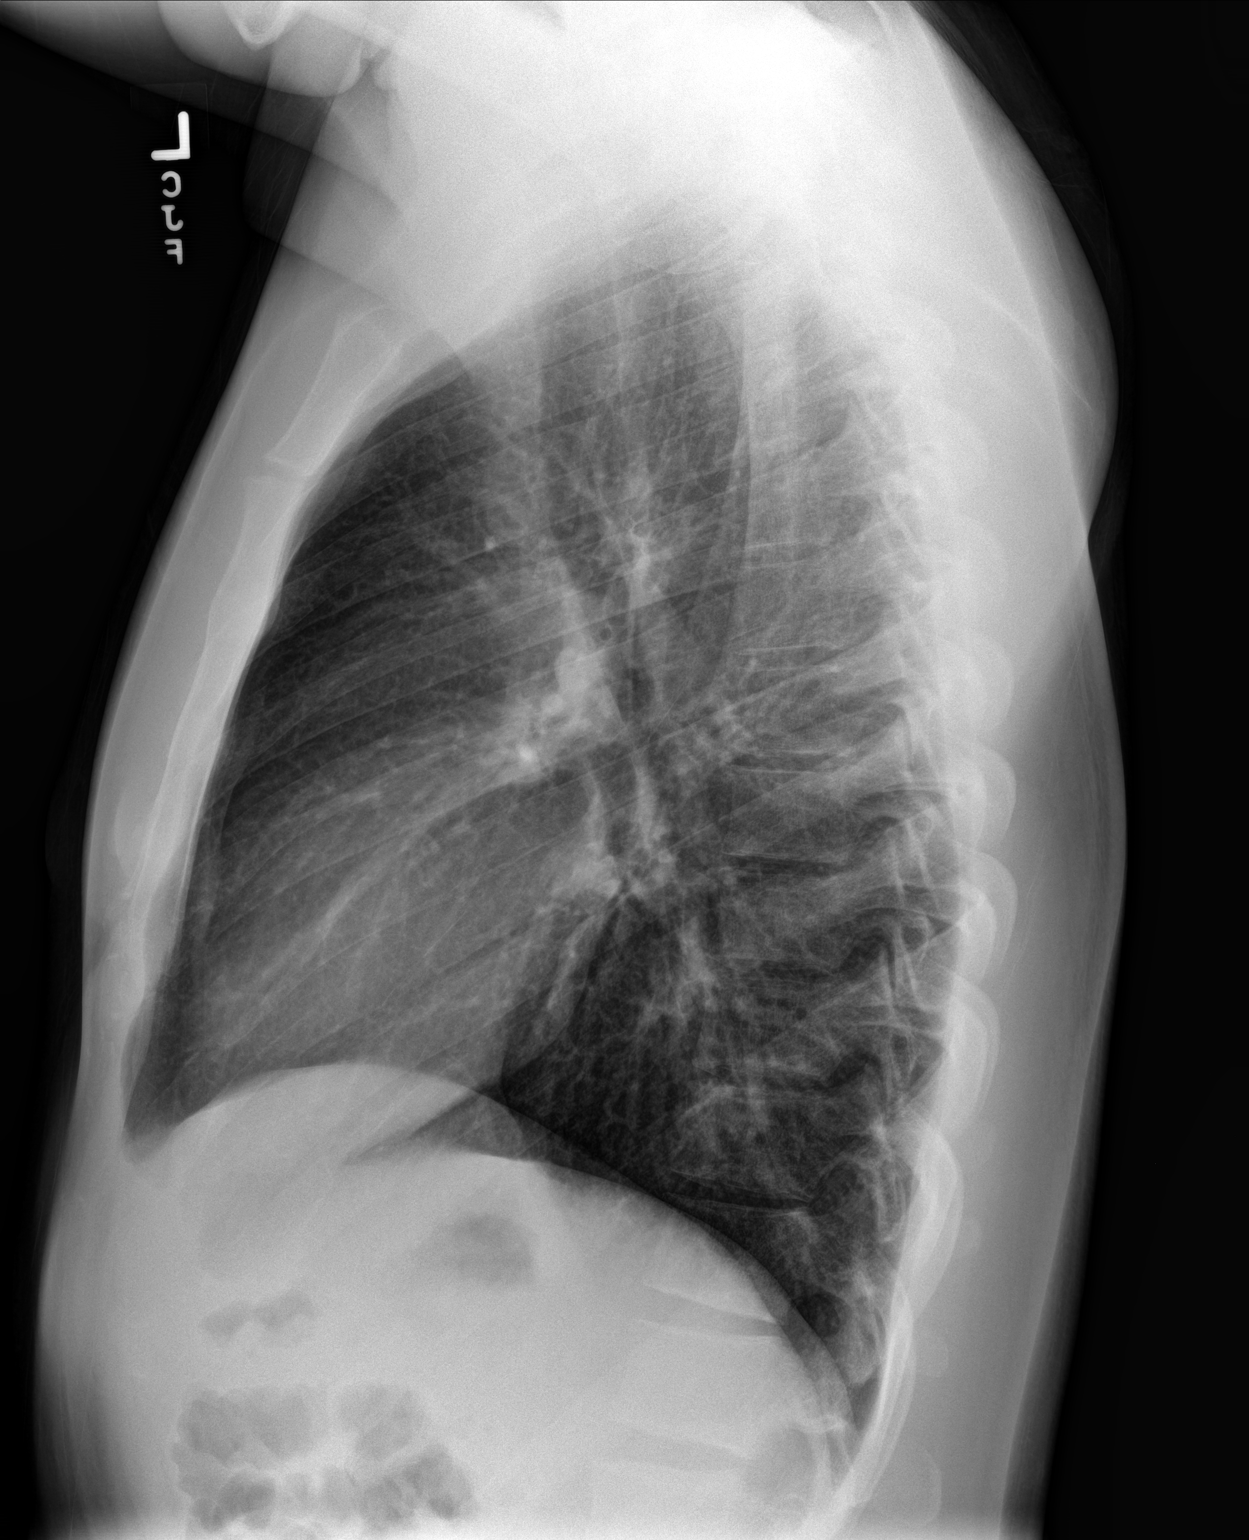

[2 of 2 positions shown; findings below may reference images not displayed]

FINDINGS: Normal heart size and mediastinal contours. No acute infiltrate or
edema. No effusion or pneumothorax. No acute osseous findings.
IMPRESSION: Negative chest.

## 2017-08-02 ENCOUNTER — Encounter: Payer: Self-pay | Admitting: Emergency Medicine

## 2017-08-02 ENCOUNTER — Ambulatory Visit
Admission: RE | Admit: 2017-08-02 | Discharge: 2017-08-02 | Disposition: A | Discharge status: Home / Self Care | Payer: BLUE CROSS/BLUE SHIELD | Source: Ambulatory Visit | Attending: Family Medicine | Admitting: Family Medicine

## 2017-08-02 ENCOUNTER — Ambulatory Visit (INDEPENDENT_AMBULATORY_CARE_PROVIDER_SITE_OTHER): Payer: Self-pay | Admitting: Family Medicine

## 2017-08-02 ENCOUNTER — Encounter: Payer: Self-pay | Admitting: Family Medicine

## 2017-08-02 VITALS — BP 120/70 | HR 72 | Ht 74.0 in | Wt 190.0 lb

## 2017-08-02 DIAGNOSIS — X58XXXA Exposure to other specified factors, initial encounter: Secondary | ICD-10-CM | POA: Insufficient documentation

## 2017-08-02 DIAGNOSIS — S3022XA Contusion of scrotum and testes, initial encounter: Secondary | ICD-10-CM

## 2017-08-02 DIAGNOSIS — N451 Epididymitis: Secondary | ICD-10-CM

## 2017-08-02 NOTE — Progress Notes (Signed)
Name: Shaun Gibbs   MRN: [id number]    DOB: Sep 09, 1990   Date:08/02/2017       Progress Note  Subjective  Chief Complaint  Chief Complaint  Patient presents with  . Testicle Pain    was playing Baseball on Saturday- grounding a ball and it bounced up into his testicles. Had a couple of days of passing blood in urine. Hasn't seen anymore blood, but still has nausea and L) testicle is painful.    Testicle Pain  The patient's primary symptoms include a genital injury and testicular pain. The patient's pertinent negatives include no genital itching, genital lesions, pelvic pain, penile discharge, penile pain, priapism or scrotal swelling. This is a new problem. The current episode started in the past 7 days (Saturday afternoon). The problem occurs constantly. The problem has been waxing and waning. The pain is medium. Associated symptoms include discolored urine and hematuria. Pertinent negatives include no abdominal pain, anorexia, chest pain, chills, constipation, coughing, diarrhea, dysuria, fever, flank pain, frequency, headaches, hesitancy, joint pain, joint swelling, nausea, painful intercourse, rash, shortness of breath, sore throat, urgency, urinary retention or vomiting. There is a testicular injury. The problem affects both (L>R) sides. The injury mechanism was a direct blow to genitals. The testicular pain affects the left testicle. There is swelling in the left testicle. The color of the testicles is red. The symptoms are aggravated by heavy lifting, urination and activity. He has tried a cold pack and OTC analgesics for the symptoms. The treatment provided mild relief.    No problem-specific Assessment & Plan notes found for this encounter.   Past Medical History:  Diagnosis Date  . Asthma     Past Surgical History:  Procedure Laterality Date  . BICEPS TENDON REPAIR Right   . nose fracture repair    . SHOULDER SURGERY Right   . thumb tendon     repair  .  TONSILLECTOMY      History reviewed. No pertinent family history.  Social History   Socioeconomic History  . Marital status: Unknown    Spouse name: Not on file  . Number of children: Not on file  . Years of education: Not on file  . Highest education level: Not on file  Social Needs  . Financial resource strain: Not on file  . Food insecurity - worry: Not on file  . Food insecurity - inability: Not on file  . Transportation needs - medical: Not on file  . Transportation needs - non-medical: Not on file  Occupational History  . Not on file  Tobacco Use  . Smoking status: Never Smoker  . Smokeless tobacco: Current User    Types: Snuff  Substance and Sexual Activity  . Alcohol use: Yes    Alcohol/week: 0.0 oz  . Drug use: No  . Sexual activity: Yes  Other Topics Concern  . Not on file  Social History Narrative   ** Merged History Encounter **        No Known Allergies  Outpatient Medications Prior to Visit  Medication Sig Dispense Refill  . albuterol (PROVENTIL HFA;VENTOLIN HFA) 108 (90 BASE) MCG/ACT inhaler Inhale 1-2 puffs into the lungs every 4 (four) hours as needed for wheezing or shortness of breath. (Patient not taking: Reported on 08/02/2017) 1 Inhaler 0  . valACYclovir (VALTREX) 500 MG tablet Take 1 tablet (500 mg total) by mouth daily. (Patient not taking: Reported on 08/02/2017) 30 tablet 5  . benzonatate (TESSALON) 200 MG capsule Take 1 capsule (  200 mg total) by mouth 3 (three) times daily as needed for cough. 21 capsule 0  . fluticasone (FLONASE) 50 MCG/ACT nasal spray Place 1 spray into both nostrils 2 (two) times daily. 16 g 0  . naproxen (NAPROSYN) 500 MG tablet Take 1 tablet (500 mg total) by mouth 2 (two) times daily with a meal. 30 tablet 0  . ranitidine (ZANTAC) 150 MG tablet Take 1 tablet (150 mg total) by mouth 2 (two) times daily. 60 tablet 0  . sodium chloride (OCEAN) 0.65 % SOLN nasal spray Place 2 sprays into both nostrils every 2 (two) hours while  awake.  0  . valACYclovir (VALTREX) 500 MG tablet Take 1 tablet (500 mg total) by mouth 2 (two) times daily. 14 tablet 0   No facility-administered medications prior to visit.     Review of Systems  Constitutional: Negative for chills, fever, malaise/fatigue and weight loss.  HENT: Negative for ear discharge, ear pain and sore throat.   Eyes: Negative for blurred vision.  Respiratory: Negative for cough, sputum production, shortness of breath and wheezing.   Cardiovascular: Negative for chest pain, palpitations and leg swelling.  Gastrointestinal: Negative for abdominal pain, anorexia, blood in stool, constipation, diarrhea, heartburn, melena, nausea and vomiting.  Genitourinary: Positive for testicular pain. Negative for discharge, dysuria, flank pain, frequency, hematuria, hesitancy, pelvic pain, penile pain, scrotal swelling and urgency.  Musculoskeletal: Negative for back pain, joint pain, myalgias and neck pain.  Skin: Negative for rash.  Neurological: Negative for dizziness, tingling, sensory change, focal weakness and headaches.  Endo/Heme/Allergies: Negative for environmental allergies and polydipsia. Does not bruise/bleed easily.  Psychiatric/Behavioral: Negative for depression and suicidal ideas. The patient is not nervous/anxious and does not have insomnia.      Objective  Vitals:   08/02/17 1540  BP: 120/70  Pulse: 72  Weight: 190 lb (86.2 kg)  Height: 6\' 2"  (1.88 m)    Physical Exam  Constitutional: He is oriented to person, place, and time and well-developed, well-nourished, and in no distress.  HENT:  Head: Normocephalic.  Right Ear: External ear normal.  Left Ear: External ear normal.  Nose: Nose normal.  Mouth/Throat: Oropharynx is clear and moist.  Eyes: Conjunctivae and EOM are normal. Pupils are equal, round, and reactive to light. Right eye exhibits no discharge. Left eye exhibits no discharge. No scleral icterus.  Neck: Normal range of motion. Neck  supple. No JVD present. No tracheal deviation present. No thyromegaly present.  Cardiovascular: Normal rate, regular rhythm, normal heart sounds and intact distal pulses. Exam reveals no gallop and no friction rub.  No murmur heard. Pulmonary/Chest: Breath sounds normal. No respiratory distress. He has no wheezes. He has no rales.  Abdominal: Soft. Bowel sounds are normal. He exhibits no mass. There is no hepatosplenomegaly. There is no tenderness. There is no rebound, no guarding and no CVA tenderness.  Genitourinary: He exhibits testicular tenderness, scrotal tenderness and epididymal tenderness. He exhibits no abnormal testicular mass and no abnormal scrotal mass.  Genitourinary Comments: Right  elevated more than the left.  Musculoskeletal: Normal range of motion. He exhibits no edema or tenderness.  Lymphadenopathy:    He has no cervical adenopathy.  Neurological: He is alert and oriented to person, place, and time. He has normal sensation, normal strength, normal reflexes and intact cranial nerves. No cranial nerve deficit.  Skin: Skin is warm. No rash noted.  Psychiatric: Mood and affect normal.  Nursing note and vitals reviewed.     Assessment &  Plan  Problem List Items Addressed This Visit    None    Visit Diagnoses    Contusion of scrotum and testes, initial encounter    -  Primary   to er eval with stat ultrasound/ discussed with urology / r/o rupture of testicle/avulsion epididymis   Relevant Orders   US Scrotum   Epididymitis       secondary to trauma      No orders of the defined types were placed in this encounter.     Dr. Hayden Rasmusseneanna Ashwath Lasch [facility]  08/02/17

## 2017-08-02 NOTE — Patient Instructions (Signed)
Testicular Torsion °Testicular torsion is a twisting of the spermatic cord, artery, and vein that go to the testicle. This twisting cuts off the blood supply to everything in the sac that contains the testes, blood vessels, and part of the spermatic cord (scrotum). Testicular torsion is most commonly seen in newborn and adolescent males. It can also occur before birth. °Testicular torsion requires emergency treatment. The testicle usually can be saved if the torsion is treated within 6 hours of onset. If the torsion is left untreated for too long, the testicle will die and have to be removed. °What are the causes? °Torsion can be caused by a hit on the scrotum or by certain movements during exercise. In some males, testicular torsion is more common because the connection of their testicle to a specific tissue in their scrotum developed in the wrong place, allowing the testicle to rotate and the cord to get twisted. °What are the signs or symptoms? °The main symptom of testicular torsion is pain in your testicle. The scrotum may be swollen, red, hard, and very tender. There will be excess fluid in the tissue (edema). The testicle may be higher than normal in the scrotum. The skin of the scrotum may be stuck to the testicle. You may have nausea, vomiting, and a fever. °How is this diagnosed? °Often testicular torsion is diagnosed through a physical exam. Sometimes imaging exams and tests to measure blood flow may be done. °How is this treated? °A manual untwisting of the testicle may be done when the testicle is still mobile and the maneuver is not too painful. However, surgery usually is necessary and should be done as soon as possible after torsion occurs. During surgery, the testicle is untwisted and evaluated and possibly removed. °This information is not intended to replace advice given to you by your health care provider. Make sure you discuss any questions you have with your health care provider. °Document  Released: 06/22/2005 Document Revised: 01/10/2016 Document Reviewed: 12/12/2012 °Elsevier Interactive Patient Education © 2017 [employer] ° °

## 2017-08-04 ENCOUNTER — Encounter: Payer: Self-pay | Admitting: Emergency Medicine

## 2017-08-04 ENCOUNTER — Ambulatory Visit
Admission: EM | Admit: 2017-08-04 | Discharge: 2017-08-04 | Disposition: A | Discharge status: Home / Self Care | Payer: BLUE CROSS/BLUE SHIELD | Attending: Family Medicine | Admitting: Family Medicine

## 2017-08-04 ENCOUNTER — Other Ambulatory Visit: Payer: Self-pay

## 2017-08-04 DIAGNOSIS — B349 Viral infection, unspecified: Secondary | ICD-10-CM

## 2017-08-04 DIAGNOSIS — H6501 Acute serous otitis media, right ear: Secondary | ICD-10-CM

## 2017-08-04 MED ORDER — AMOXICILLIN 875 MG PO TABS: 875.0000 mg | ORAL_TABLET | Freq: Two times a day (BID) | ORAL | 0 refills | Status: DC

## 2017-08-04 MED ORDER — HYDROCOD POLST-CPM POLST ER 10-8 MG/5ML PO SUER: 5.0000 mL | Freq: Two times a day (BID) | ORAL | 0 refills | Status: DC | PRN

## 2017-08-04 NOTE — ED Triage Notes (Signed)
Patient c/o cough, bodyaches, and fever that started on Monday.

## 2017-08-04 NOTE — ED Provider Notes (Signed)
[facility]    CSN: [id number] Arrival date & time: 08/04/17  1647     History   Chief Complaint Chief Complaint  Patient presents with  . Cough  . Generalized Body Aches  . Fever    HPI Shaun Gibbs is a 27 y.o. male.   The history is provided by the patient.  URI  Presenting symptoms: congestion, cough, ear pain (left), fatigue and fever   Severity:  Moderate Onset quality:  Sudden Duration:  3 days Timing:  Constant Progression:  Worsening Chronicity:  New Relieved by:  Nothing Ineffective treatments:  OTC medications Associated symptoms: myalgias   Associated symptoms: no wheezing   Risk factors: sick contacts   Risk factors: not elderly, no chronic cardiac disease, no chronic kidney disease, no diabetes mellitus, no immunosuppression, no recent illness and no recent travel     Past Medical History:  Diagnosis Date  . Asthma     There are no active problems to display for this patient.   Past Surgical History:  Procedure Laterality Date  . BICEPS TENDON REPAIR Right   . nose fracture repair    . SHOULDER SURGERY Right   . thumb tendon     repair  . TONSILLECTOMY         Home Medications    Prior to Admission medications   Medication Sig Start Date End Date Taking? Authorizing Provider  albuterol (PROVENTIL HFA;VENTOLIN HFA) 108 (90 BASE) MCG/ACT inhaler Inhale 1-2 puffs into the lungs every 4 (four) hours as needed for wheezing or shortness of breath. 01/21/15  Yes Betancourt, Jarold Song, NP  traMADol (ULTRAM) 50 MG tablet Take 50 mg by mouth every 6 (six) hours as needed.   Yes [provider]  amoxicillin (AMOXIL) 875 MG tablet Take 1 tablet (875 mg total) by mouth 2 (two) times daily. 08/04/17   Payton Mccallum, MD  chlorpheniramine-HYDROcodone (TUSSIONEX PENNKINETIC ER) 10-8 MG/5ML SUER Take 5 mLs by mouth every 12 (twelve) hours as needed. 08/04/17   Payton Mccallum, MD  valACYclovir (VALTREX) 500 MG tablet Take 1  tablet (500 mg total) by mouth daily. Patient not taking: Reported on 08/02/2017 09/24/15   Duanne Limerick, MD    Family History History reviewed. No pertinent family history.  Social History Social History   Tobacco Use  . Smoking status: Never Smoker  . Smokeless tobacco: Current User    Types: Snuff  Substance Use Topics  . Alcohol use: Yes    Alcohol/week: 0.0 oz  . Drug use: No     Allergies   Patient has no known allergies.   Review of Systems Review of Systems  Constitutional: Positive for fatigue and fever.  HENT: Positive for congestion and ear pain (left).   Respiratory: Positive for cough. Negative for wheezing.   Musculoskeletal: Positive for myalgias.     Physical Exam Triage Vital Signs ED Triage Vitals  Enc Vitals Group     BP 08/04/17 1659 128/81     Pulse Rate 08/04/17 1659 82     Resp 08/04/17 1659 16     Temp 08/04/17 1659 98.4 F (36.9 C)     Temp Source 08/04/17 1659 Oral     SpO2 08/04/17 1659 100 %     Weight 08/04/17 1656 190 lb (86.2 kg)     Height 08/04/17 1656 6\' 2"  (1.88 m)     Head Circumference --      Peak Flow --      Pain  Score 08/04/17 1656 8     Pain Loc --      Pain Edu? --      Excl. in GC? --    No data found.  Updated Vital Signs BP 128/81 (BP Location: Left Arm)   Pulse 82   Temp 98.4 F (36.9 C) (Oral)   Resp 16   Ht 6\' 2"  (1.88 m)   Wt 190 lb (86.2 kg)   SpO2 100%   BMI 24.39 kg/m   Visual Acuity Right Eye Distance:   Left Eye Distance:   Bilateral Distance:    Right Eye Near:   Left Eye Near:    Bilateral Near:     Physical Exam  Constitutional: He appears well-developed and well-nourished. No distress.  HENT:  Head: Normocephalic and atraumatic.  Right Ear: Tympanic membrane, external ear and ear canal normal.  Left Ear: External ear and ear canal normal. Tympanic membrane is erythematous and bulging. A middle ear effusion is present.  Nose: Nose normal.  Mouth/Throat: Uvula is midline,  oropharynx is clear and moist and mucous membranes are normal. No oropharyngeal exudate or tonsillar abscesses.  Eyes: Conjunctivae and EOM are normal. Pupils are equal, round, and reactive to light. Right eye exhibits no discharge. Left eye exhibits no discharge. No scleral icterus.  Neck: Normal range of motion. Neck supple. No tracheal deviation present. No thyromegaly present.  Cardiovascular: Normal rate, regular rhythm and normal heart sounds.  Pulmonary/Chest: Effort normal and breath sounds normal. No stridor. No respiratory distress. He has no wheezes. He has no rales. He exhibits no tenderness.  Lymphadenopathy:    He has no cervical adenopathy.  Neurological: He is alert.  Skin: Skin is warm and dry. No rash noted. He is not diaphoretic.  Nursing note and vitals reviewed.    UC Treatments / Results  Labs (all labs ordered are listed, but only abnormal results are displayed) Labs Reviewed - No data to display  EKG  EKG Interpretation None       Radiology [place] Scrotum W/doppler  Result Date: 08/02/2017 CLINICAL DATA:  27 year old male with scrotal pain from scrotal trauma 3 days ago. EXAM: SCROTAL ULTRASOUND DOPPLER ULTRASOUND OF THE TESTICLES TECHNIQUE: Complete ultrasound examination of the testicles, epididymis, and other scrotal structures was performed. Color and spectral Doppler ultrasound were also utilized to evaluate blood flow to the testicles. COMPARISON:  None. FINDINGS: Right testicle Measurements: 4.8 x 2.6 x 3.6 cm. No mass or microlithiasis visualized. Left testicle Measurements: 5.1 x 2.4 x 3.2 cm. No mass or microlithiasis visualized. Right epididymis:  Normal in size and appearance. Left epididymis:  Normal in size and appearance. Hydrocele:  None visualized. Varicocele:  None visualized. Pulsed Doppler interrogation of both testes demonstrates normal low resistance arterial and venous waveforms bilaterally. IMPRESSION: Normal scrotal ultrasound. No evidence of  testicular mass, hematoma or torsion. Electronically Signed   By: Harmon PierJeffrey  Hu M.D.   On: 08/02/2017 18:33    Procedures Procedures (including critical care time)  Medications Ordered in UC Medications - No data to display   Initial Impression / Assessment and Plan / UC Course  I have reviewed the triage vital signs and the nursing notes.  Pertinent labs & imaging results that were available during my care of the patient were reviewed by me and considered in my medical decision making (see chart for details).       Final Clinical Impressions(s) / UC Diagnoses   Final diagnoses:  Viral syndrome  Right acute serous  otitis media, recurrence not specified    ED Discharge Orders        Ordered    amoxicillin (AMOXIL) 875 MG tablet  2 times daily     08/04/17 1725    chlorpheniramine-HYDROcodone (TUSSIONEX PENNKINETIC ER) 10-8 MG/5ML SUER  Every 12 hours PRN     08/04/17 1727     1. diagnosis reviewed with patient 2. rx as per orders above; reviewed possible side effects, interactions, risks and benefits  3. Recommend supportive treatment with rest, fluids, otc analgesics prn 4. Follow-up prn if symptoms worsen or don't improve  Controlled Substance Prescriptions [place] Controlled Substance Registry consulted? Not Applicable   Payton Mccallum, MD 08/04/17 [phone]

## 2017-08-07 ENCOUNTER — Telehealth: Payer: Self-pay

## 2017-08-07 NOTE — Telephone Encounter (Signed)
[facility]. VM not set up. Patient will call back with any questions or concerns. [facility]

## 2018-09-16 ENCOUNTER — Other Ambulatory Visit: Payer: Self-pay

## 2018-09-16 ENCOUNTER — Encounter: Payer: Self-pay | Admitting: Family Medicine

## 2018-09-16 ENCOUNTER — Ambulatory Visit: Payer: Commercial Managed Care - PPO | Admitting: Family Medicine

## 2018-09-16 VITALS — BP 110/70 | HR 86 | Temp 98.0°F | Ht 74.0 in | Wt 172.0 lb

## 2018-09-16 DIAGNOSIS — J0100 Acute maxillary sinusitis, unspecified: Secondary | ICD-10-CM | POA: Diagnosis not present

## 2018-09-16 MED ORDER — AMOXICILLIN 500 MG PO CAPS: 500.0000 mg | ORAL_CAPSULE | Freq: Three times a day (TID) | ORAL | 0 refills | Status: DC

## 2018-09-16 NOTE — Progress Notes (Signed)
Date:  09/16/2018   Name:  Shaun Gibbs   DOB:  1990-07-26   MRN:  [id number]   Chief Complaint: Fever (last night, has been feeling drained)  Fever   This is a new problem. The current episode started today (last night/102). The problem occurs intermittently. The problem has been unchanged. The maximum temperature noted was 102 to 102.9 F. The temperature was taken using an oral thermometer. Associated symptoms include abdominal pain, congestion, ear pain, headaches and muscle aches. Pertinent negatives include no chest pain, coughing, diarrhea, nausea, rash, sleepiness, sore throat, urinary pain, vomiting or wheezing. Associated symptoms comments: Postnasal/clear. He has tried nothing for the symptoms.  Risk factors: sick contacts   Risk factors: no contaminated food, no contaminated water, no hx of cancer, no immunosuppression, no occupational exposure, no recent sickness and no recent travel   Risk factors comment:  Brother with "flu"   Review of Systems  Constitutional: Positive for fever. Negative for chills.  HENT: Positive for congestion and ear pain. Negative for drooling, ear discharge and sore throat.   Respiratory: Negative for cough, chest tightness, shortness of breath and wheezing.   Cardiovascular: Negative for chest pain, palpitations and leg swelling.  Gastrointestinal: Positive for abdominal pain. Negative for blood in stool, constipation, diarrhea, nausea and vomiting.  Endocrine: Negative for polydipsia.  Genitourinary: Negative for dysuria, frequency, hematuria and urgency.  Musculoskeletal: Negative for back pain, myalgias and neck pain.  Skin: Negative for rash.  Allergic/Immunologic: Negative for environmental allergies.  Neurological: Positive for headaches. Negative for dizziness.  Hematological: Does not bruise/bleed easily.  Psychiatric/Behavioral: Negative for suicidal ideas. The patient is not nervous/anxious.     There are no active  problems to display for this patient.   No Known Allergies  Past Surgical History:  Procedure Laterality Date  . BICEPS TENDON REPAIR Right   . nose fracture repair    . SHOULDER SURGERY Right   . thumb tendon     repair  . TONSILLECTOMY      Social History   Tobacco Use  . Smoking status: Never Smoker  . Smokeless tobacco: Current User    Types: Snuff  Substance Use Topics  . Alcohol use: Yes    Alcohol/week: 0.0 standard drinks  . Drug use: No     Medication list has been reviewed and updated.  No outpatient medications have been marked as taking for the 09/16/18 encounter (Office Visit) with Duanne Limerick, MD.    [facility] 2/9 Scores 08/02/2017 09/24/2015  PHQ - 2 Score 0 0  PHQ- 9 Score 0 -    Physical Exam Vitals signs and nursing note reviewed.  HENT:     Head: Normocephalic.     Jaw: There is normal jaw occlusion.     Right Ear: Hearing, tympanic membrane, ear canal and external ear normal.     Left Ear: Hearing, tympanic membrane, ear canal and external ear normal.     Nose: Congestion and rhinorrhea present.     Right Turbinates: Swollen. Not enlarged.     Left Turbinates: Swollen. Not enlarged.     Right Sinus: Maxillary sinus tenderness present. No frontal sinus tenderness.     Left Sinus: Maxillary sinus tenderness present. No frontal sinus tenderness.  Eyes:     General: No scleral icterus.       Right eye: No discharge.        Left eye: No discharge.     Conjunctiva/sclera: Conjunctivae normal.  Pupils: Pupils are equal, round, and reactive to light.  Neck:     Musculoskeletal: Normal range of motion and neck supple.     Thyroid: No thyromegaly.     Vascular: No JVD.     Trachea: No tracheal deviation.  Cardiovascular:     Rate and Rhythm: Normal rate and regular rhythm.     Pulses: Normal pulses.     Heart sounds: Normal heart sounds. No murmur. No friction rub. No gallop.   Pulmonary:     Effort: Pulmonary effort is normal. No respiratory  distress.     Breath sounds: Normal breath sounds. No wheezing or rales.  Abdominal:     General: Abdomen is flat. Bowel sounds are normal.     Palpations: Abdomen is soft. There is no hepatomegaly, splenomegaly or mass.     Tenderness: There is no abdominal tenderness. There is no guarding or rebound.  Musculoskeletal: Normal range of motion.        General: No tenderness.  Lymphadenopathy:     Cervical: No cervical adenopathy.  Skin:    General: Skin is warm.     Findings: No rash.  Neurological:     Mental Status: He is alert and oriented to person, place, and time.     Cranial Nerves: No cranial nerve deficit.     Deep Tendon Reflexes: Reflexes are normal and symmetric.     Wt Readings from Last 3 Encounters:  09/16/18 172 lb (78 kg)  08/04/17 190 lb (86.2 kg)  08/02/17 190 lb (86.2 kg)    BP 110/70   Pulse 86   Temp 98 F (36.7 C)   Ht 6\' 2"  (1.88 m)   Wt 172 lb (78 kg)   BMI 22.08 kg/m    Assessment and Plan: 1. Acute maxillary sinusitis, recurrence not specified Patient does not have a fever today at the office at 49 F but noted to have an elevated reading at home influenza test was negative for influenza a and B.  Sam notes that he has tenderness over the maxillary sinuses bilateral will initiate amoxicillin 500 mg 3 times a day for suspected sinus infection patient is to return if fever persist. - amoxicillin (AMOXIL) 500 MG capsule; Take 1 capsule (500 mg total) by mouth 3 (three) times daily.  Dispense: 30 capsule; Refill: 0

## 2019-05-11 ENCOUNTER — Other Ambulatory Visit: Payer: Self-pay

## 2019-05-11 ENCOUNTER — Ambulatory Visit
Admission: EM | Admit: 2019-05-11 | Discharge: 2019-05-11 | Disposition: A | Discharge status: Home / Self Care | Payer: Commercial Managed Care - PPO | Attending: Family Medicine | Admitting: Family Medicine

## 2019-05-11 ENCOUNTER — Encounter: Payer: Self-pay | Admitting: Emergency Medicine

## 2019-05-11 DIAGNOSIS — H9203 Otalgia, bilateral: Secondary | ICD-10-CM

## 2019-05-11 DIAGNOSIS — J029 Acute pharyngitis, unspecified: Secondary | ICD-10-CM

## 2019-05-11 DIAGNOSIS — M791 Myalgia, unspecified site: Secondary | ICD-10-CM | POA: Diagnosis not present

## 2019-05-11 DIAGNOSIS — R05 Cough: Secondary | ICD-10-CM

## 2019-05-11 DIAGNOSIS — R110 Nausea: Secondary | ICD-10-CM

## 2019-05-11 DIAGNOSIS — J111 Influenza due to unidentified influenza virus with other respiratory manifestations: Secondary | ICD-10-CM

## 2019-05-11 LAB — RAPID STREP SCREEN (MED CTR MEBANE ONLY): Streptococcus, Group A Screen (Direct): NEGATIVE

## 2019-05-11 LAB — RAPID INFLUENZA A&B ANTIGENS (ARMC ONLY)
Influenza A (ARMC): NEGATIVE
Influenza B (ARMC): NEGATIVE

## 2019-05-11 MED ORDER — KETOROLAC TROMETHAMINE 10 MG PO TABS: 10.0000 mg | ORAL_TABLET | Freq: Four times a day (QID) | ORAL | 0 refills | Status: DC | PRN

## 2019-05-11 MED ORDER — OSELTAMIVIR PHOSPHATE 75 MG PO CAPS: 75.0000 mg | ORAL_CAPSULE | Freq: Two times a day (BID) | ORAL | 0 refills | Status: DC

## 2019-05-11 NOTE — ED Triage Notes (Signed)
Pt c/o nausea, body aches, subjective fever, cough, scratchy throat, bilateral ear pain. Started this morning.

## 2019-05-11 NOTE — Discharge Instructions (Signed)
Medication as prescribed. ° °Awaiting COVID test. ° °Take care ° °Dr. Bron Snellings  °

## 2019-05-11 NOTE — ED Provider Notes (Signed)
[facility]    CSN: [id number] Arrival date & time: 05/11/19  1903      History   Chief Complaint Chief Complaint  Patient presents with  . Generalized Body Aches   HPI   28 year old male presents with multiple complaints.  Patient reports that his symptoms started abruptly today.  He reports diffuse body aches which are severe.  Subjective fever and sweats.  Also reports cough, sore throat, bilateral ear pain, and nausea.  Feels very poorly.  Denies sick contacts.  No known exposure to COVID-19.  No medications or interventions tried.  He rates his pain as 7/10 in severity.  No exacerbating relieving factors.  No other reported symptoms.  No other complaints.  PMH, Surgical Hx, Family Hx, Social History reviewed and updated as below.  Past Medical History:  Diagnosis Date  . Asthma    Past Surgical History:  Procedure Laterality Date  . BICEPS TENDON REPAIR Right   . nose fracture repair    . SHOULDER SURGERY Right   . thumb tendon     repair  . TONSILLECTOMY      Home Medications    Prior to Admission medications   Medication Sig Start Date End Date Taking? Authorizing Provider  ketorolac (TORADOL) 10 MG tablet Take 1 tablet (10 mg total) by mouth every 6 (six) hours as needed for moderate pain or severe pain. 05/11/19   Tommie Sams, DO  oseltamivir (TAMIFLU) 75 MG capsule Take 1 capsule (75 mg total) by mouth every 12 (twelve) hours. 05/11/19   Tommie Sams, DO    Family History Family History  Problem Relation Age of Onset  . Healthy Mother   . Healthy Father     Social History Social History   Tobacco Use  . Smoking status: Never Smoker  . Smokeless tobacco: Current User    Types: Snuff  Substance Use Topics  . Alcohol use: Yes    Alcohol/week: 0.0 standard drinks  . Drug use: No     Allergies   Patient has no known allergies.   Review of Systems Review of Systems Per HPI  Physical Exam Triage Vital Signs ED Triage Vitals   Enc Vitals Group     BP 05/11/19 1927 118/78     Pulse Rate 05/11/19 1927 87     Resp 05/11/19 1927 18     Temp 05/11/19 1927 98.7 F (37.1 C)     Temp Source 05/11/19 1927 Oral     SpO2 05/11/19 1927 99 %     Weight 05/11/19 1923 185 lb (83.9 kg)     Height 05/11/19 1923 6\' 2"  (1.88 m)     Head Circumference --      Peak Flow --      Pain Score 05/11/19 1922 7     Pain Loc --      Pain Edu? --      Excl. in GC? --    Updated Vital Signs BP 118/78 (BP Location: Left Arm)   Pulse 87   Temp 98.7 F (37.1 C) (Oral)   Resp 18   Ht 6\' 2"  (1.88 m)   Wt 83.9 kg   SpO2 99%   BMI 23.75 kg/m   Visual Acuity Right Eye Distance:   Left Eye Distance:   Bilateral Distance:    Right Eye Near:   Left Eye Near:    Bilateral Near:     Physical Exam Vitals signs and nursing note reviewed.  Constitutional:  General: He is not in acute distress.    Appearance: Normal appearance. He is not ill-appearing.  HENT:     Head: Normocephalic and atraumatic.     Ears:     Comments: TMs with scarring.  No evidence of infection.    Mouth/Throat:     Pharynx: Posterior oropharyngeal erythema present. No oropharyngeal exudate.  Eyes:     General:        Right eye: No discharge.        Left eye: No discharge.     Conjunctiva/sclera: Conjunctivae normal.  Neck:     Musculoskeletal: Neck supple.  Cardiovascular:     Rate and Rhythm: Normal rate and regular rhythm.     Heart sounds: No murmur.  Pulmonary:     Effort: Pulmonary effort is normal.     Breath sounds: Normal breath sounds. No wheezing, rhonchi or rales.  Lymphadenopathy:     Cervical: No cervical adenopathy.  Neurological:     Mental Status: He is alert.  Psychiatric:        Mood and Affect: Mood normal.        Behavior: Behavior normal.    UC Treatments / Results  Labs (all labs ordered are listed, but only abnormal results are displayed) Labs Reviewed  RAPID INFLUENZA A&B ANTIGENS ([facility] ONLY)  RAPID STREP  SCREEN ([facility] ONLY)  NOVEL CORONAVIRUS, NAA (HOSP ORDER, SEND-OUT TO REF LAB; TAT 18-24 HRS)  CULTURE, GROUP A STREP [facility])    EKG   Radiology No results found.  Procedures Procedures (including critical care time)  Medications Ordered in UC Medications - No data to display  Initial Impression / Assessment and Plan / UC Course  I have reviewed the triage vital signs and the nursing notes.  Pertinent labs & imaging results that were available during my care of the patient were reviewed by me and considered in my medical decision making (see chart for details).    28 year old male presents with influenza-like illness.  Possible Covid.  Awaiting Covid test result.  Rapid influenza test was negative today.  Placing on Tamiflu to cover for influenza as the sensitivity and specificity of our rapid test is not stellar.  Toradol as needed for pain.  Work note given.  Final Clinical Impressions(s) / UC Diagnoses   Final diagnoses:  Influenza-like illness     Discharge Instructions     Medication as prescribed.  Awaiting COVID test.  Take care  Dr. Lacinda Axon    ED Prescriptions    Medication Sig Dispense Auth. Provider   oseltamivir (TAMIFLU) 75 MG capsule Take 1 capsule (75 mg total) by mouth every 12 (twelve) hours. 10 capsule Hollyn Stucky G, DO   ketorolac (TORADOL) 10 MG tablet Take 1 tablet (10 mg total) by mouth every 6 (six) hours as needed for moderate pain or severe pain. 20 tablet Coral Spikes, DO     PDMP not reviewed this encounter.   Coral Spikes, [place] 05/11/19 2020

## 2019-05-13 LAB — CULTURE, GROUP A STREP (THRC)

## 2019-05-13 LAB — NOVEL CORONAVIRUS, NAA (HOSP ORDER, SEND-OUT TO REF LAB; TAT 18-24 HRS): SARS-CoV-2, NAA: NOT DETECTED

## 2019-05-17 ENCOUNTER — Ambulatory Visit
Admission: EM | Admit: 2019-05-17 | Discharge: 2019-05-17 | Disposition: A | Discharge status: Home / Self Care | Payer: Commercial Managed Care - PPO | Attending: Family Medicine | Admitting: Family Medicine

## 2019-05-17 ENCOUNTER — Other Ambulatory Visit: Payer: Self-pay

## 2019-05-17 ENCOUNTER — Telehealth: Payer: Self-pay | Admitting: Family Medicine

## 2019-05-17 DIAGNOSIS — G44209 Tension-type headache, unspecified, not intractable: Secondary | ICD-10-CM | POA: Diagnosis not present

## 2019-05-17 DIAGNOSIS — B349 Viral infection, unspecified: Secondary | ICD-10-CM

## 2019-05-17 DIAGNOSIS — Z7189 Other specified counseling: Secondary | ICD-10-CM

## 2019-05-17 MED ORDER — PROMETHAZINE HCL 25 MG/ML IJ SOLN
25.0000 mg | Freq: Once | INTRAMUSCULAR | Status: AC
  Administered 2019-05-17: 25 mg via INTRAMUSCULAR

## 2019-05-17 MED ORDER — KETOROLAC TROMETHAMINE 60 MG/2ML IM SOLN
60.0000 mg | Freq: Once | INTRAMUSCULAR | Status: AC
  Administered 2019-05-17: 60 mg via INTRAMUSCULAR

## 2019-05-17 NOTE — Discharge Instructions (Signed)
Tylenol, ibuprofen and over the counter as needed. Rest. Drink plenty of fluids.   Follow up with your primary care physician this week as needed. Return to Urgent care for new or worsening concerns.

## 2019-05-17 NOTE — Telephone Encounter (Signed)
Mom said that Shaun Gibbs is having severe headaches and cant sleep at night. She said he tested for Covid last Tuesday and it was negative.

## 2019-05-17 NOTE — Telephone Encounter (Signed)
If he is having "severe headaches", he needs to go to ER

## 2019-05-17 NOTE — ED Provider Notes (Signed)
[facility] ____________________________________________  Time seen: Approximately 3:13 PM  I have reviewed the triage vital signs and the nursing notes.   HISTORY  Chief Complaint Headache   HPI Shaun Gibbs is a 28 y.o. male presenting for evaluation of headache, fatigue and body aches.  Patient was seen 6 days ago for the same complaints as well as sore throat, fever and congestion.  Patient reports the congestion has mostly resolved, no longer having a sore throat and no more fever.  Has been having intermittent nausea.  Reports he has been having intermittent headaches throughout this past 6 days but last night was more so.  Did just complete Tamiflu yesterday.  Patient had a negative strep, flu and Covid 19 test at last urgent care visit.  Denies home sick contacts.  States he has continued to eat and drink well.  States has not been taken Tylenol or ibuprofen as he does not like to take medications on a regular basis.  No medication taken today prior to arrival.  States headache comes and goes and describes current headache as mild to moderate to forehead.  Denies vision changes, dizziness, confusion, head injury, chest pain, shortness of breath, vomiting, diarrhea or rash.  Reports continues to smell normally but not tasting normally.  Denies other aggravating or alleviating factors.  Juline Patch, MD : PCP   Past Medical History:  Diagnosis Date  . Asthma     There are no active problems to display for this patient.   Past Surgical History:  Procedure Laterality Date  . BICEPS TENDON REPAIR Right   . nose fracture repair    . SHOULDER SURGERY Right   . thumb tendon     repair  . TONSILLECTOMY       No current facility-administered medications for this encounter.   Current Outpatient Medications:  .  ketorolac (TORADOL) 10 MG tablet, Take 1 tablet (10 mg total) by mouth every 6 (six) hours as needed for moderate pain or severe pain., Disp:  20 tablet, Rfl: 0 .  oseltamivir (TAMIFLU) 75 MG capsule, Take 1 capsule (75 mg total) by mouth every 12 (twelve) hours., Disp: 10 capsule, Rfl: 0  Allergies Patient has no known allergies.  Family History  Problem Relation Age of Onset  . Healthy Mother   . Healthy Father     Social History Social History   Tobacco Use  . Smoking status: Never Smoker  . Smokeless tobacco: Current User    Types: Snuff  Substance Use Topics  . Alcohol use: Yes    Alcohol/week: 0.0 standard drinks  . Drug use: No    Review of Systems Constitutional: No fever. Eyes: No visual changes. ENT: No sore throat. Cardiovascular: Denies chest pain. Respiratory: Denies shortness of breath. Gastrointestinal: No abdominal pain.  Some nausea, no vomiting.  No diarrhea.  Genitourinary: Negative for dysuria. Musculoskeletal: Negative for back pain. Skin: Negative for rash. Neurological: Positive for headaches. Negative focal weakness or numbness.   ____________________________________________   PHYSICAL EXAM:  VITAL SIGNS: ED Triage Vitals  Enc Vitals Group     BP 05/17/19 1419 113/75     Pulse Rate 05/17/19 1419 61     Resp 05/17/19 1419 18     Temp 05/17/19 1419 98.2 F (36.8 C)     Temp src --      SpO2 05/17/19 1419 98 %     Weight --      Height --  Head Circumference --      Peak Flow --      Pain Score 05/17/19 1417 8     Pain Loc --      Pain Edu? --      Excl. in GC? --     Constitutional: Alert and oriented. Well appearing and in no acute distress. Eyes: Conjunctivae are normal. PERRL. EOMI. No pain with EOMs.  ENT      Head: Normocephalic and atraumatic.  No frontal sinus tenderness palpation.  Minimal bilateral maxillary sinus tender to palpation.      Nose: No congestion      Mouth/Throat: Mucous membranes are moist.Oropharynx non-erythematous.  No tonsillar swelling or shape. Neck: No stridor. Supple without meningismus.  Hematological/Lymphatic/Immunilogical: No  cervical lymphadenopathy. Cardiovascular: Normal rate, regular rhythm. Grossly normal heart sounds.  Good peripheral circulation. Respiratory: Normal respiratory effort without tachypnea nor retractions. Breath sounds are clear and equal bilaterally. No wheezes, rales, rhonchi. Gastrointestinal: Soft and nontender.  Musculoskeletal: Steady gait. Neurologic:  Normal speech and language. No gross focal neurologic deficits are appreciated. Speech is normal. No gait instability.  No paresthesias.  Negative pronator drift.  Negative Romberg.  No ataxia. Skin:  Skin is warm, dry and intact. No rash noted. Psychiatric: Mood and affect are normal. Speech and behavior are normal. Patient exhibits appropriate insight and judgment   ___________________________________________   LABS (all labs ordered are listed, but only abnormal results are displayed)  Labs Reviewed  NOVEL CORONAVIRUS, NAA (HOSP ORDER, SEND-OUT TO REF LAB; TAT 18-24 HRS)    PROCEDURES Procedures    INITIAL IMPRESSION / ASSESSMENT AND PLAN / ED COURSE  Pertinent labs & imaging results that were available during my care of the patient were reviewed by me and considered in my medical decision making (see chart for details).  Overall well-appearing patient.  No acute distress. No focal neurological deficits.  Suspect continued viral symptoms.  COVID-19 with testing was negative, concern of symptoms, repeat testing.  60 mg IM Toradol given once and 25 mg of Phenergan once.  Continue rest, fluids, supportive care.  Strongly encourage over-the-counter Tylenol, ibuprofen, supportive care.  Discussed very strict follow-up and return parameters. Proceed directly to ER for worsening complaints. Discussed indication, risks and benefits of medications with patient.   Discussed follow up with Primary care physician this week. Discussed follow up and return parameters including no resolution or any worsening concerns. Patient verbalized  understanding and agreed to plan.    ____________________________________________   FINAL CLINICAL IMPRESSION(S) / ED DIAGNOSES  Final diagnoses:  Acute non intractable tension-type headache  Viral illness  Advice given about COVID-19 virus infection     ED Discharge Orders    None       Note: This dictation was prepared with Dragon dictation along with smaller phrase technology. Any transcriptional errors that result from this process are unintentional.         Renford Dills, NP 05/17/19 1530

## 2019-05-17 NOTE — ED Triage Notes (Signed)
Pt presents with complaints of headache that started last night. States he was sick last week with boxy aches, cough and sore throat. States that his COVID test was negative. He also completed treatment for the flu yesterday.

## 2019-05-18 LAB — NOVEL CORONAVIRUS, NAA (HOSP ORDER, SEND-OUT TO REF LAB; TAT 18-24 HRS): SARS-CoV-2, NAA: NOT DETECTED

## 2019-11-28 IMAGING — US US SCROTUM W/ DOPPLER COMPLETE
1 series · 14 of 25 positions shown · non-contrast
Comparison: None.

CLINICAL DATA: 26-year-old male with scrotal pain from scrotal
trauma 3 days ago.

EXAM:
SCROTAL ULTRASOUND
DOPPLER ULTRASOUND OF THE TESTICLES
TECHNIQUE: Complete ultrasound examination of the testicles, epididymis, and
other scrotal structures was performed. Color and spectral Doppler
ultrasound were also utilized to evaluate blood flow to the
testicles.

[Series 1: us scrotum w/ doppler complete · 0.08mm/px · 14 of 78 slices shown]
[im 1/78]
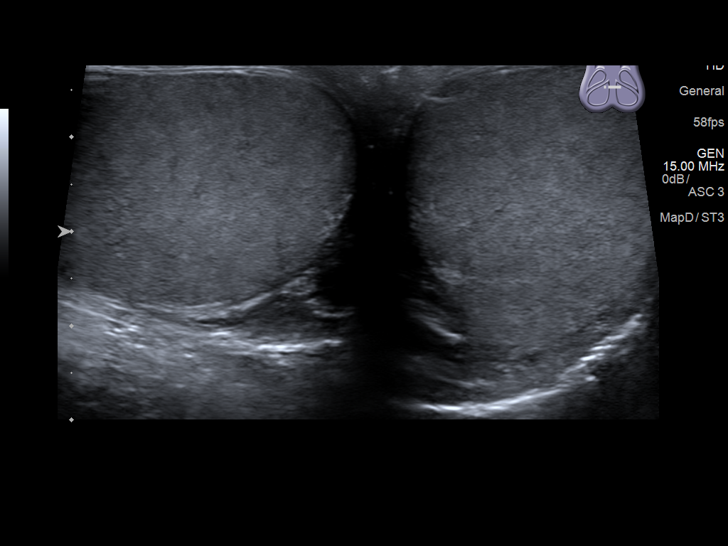
[im 7/78]
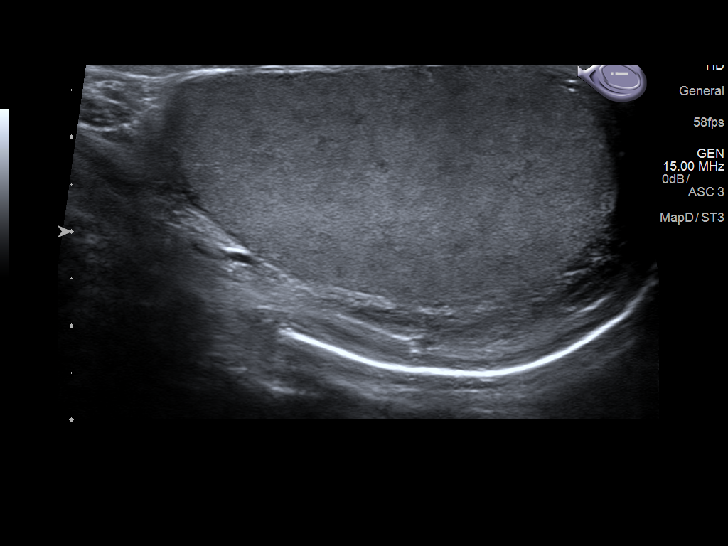
[im 13/78]
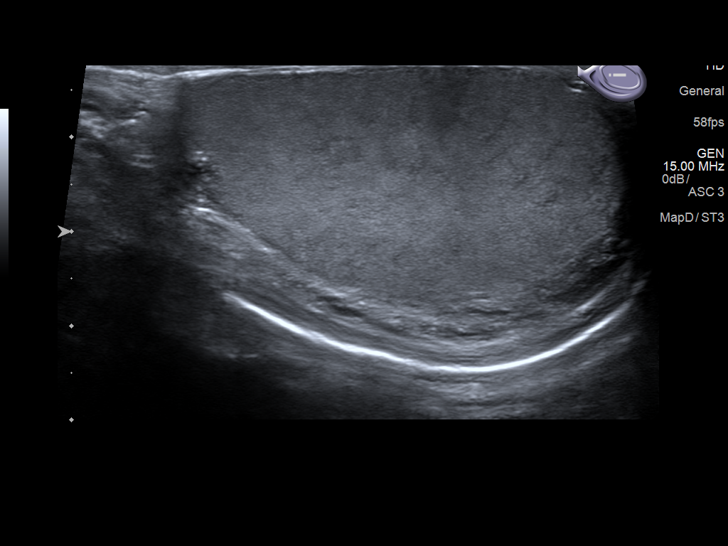
[im 20/78]
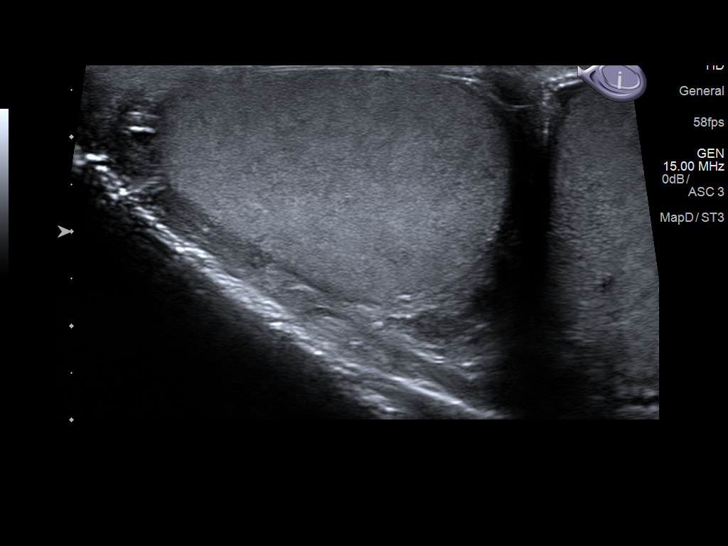
[im 26/78]
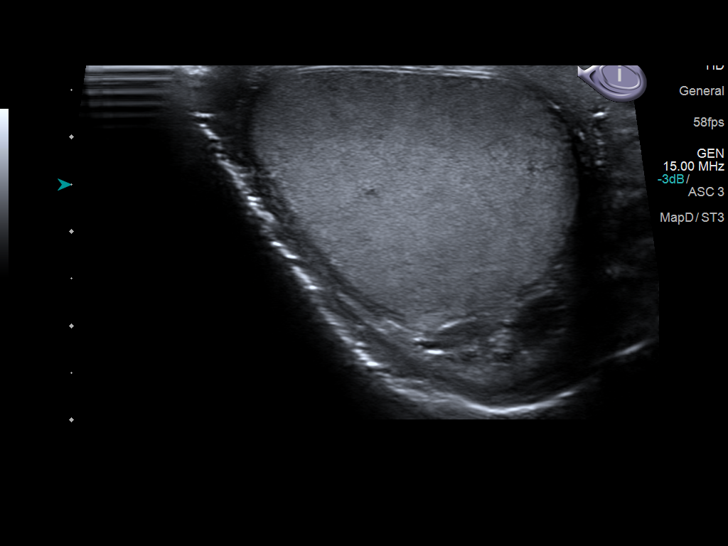
[im 29/78]
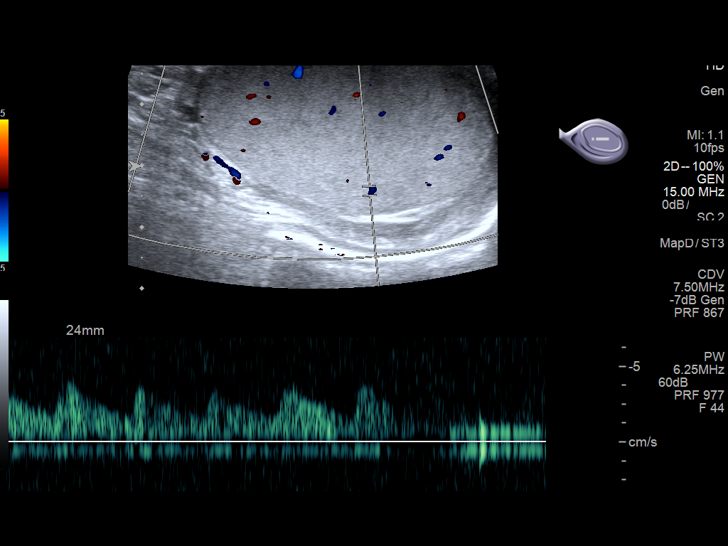
[im 36/78]
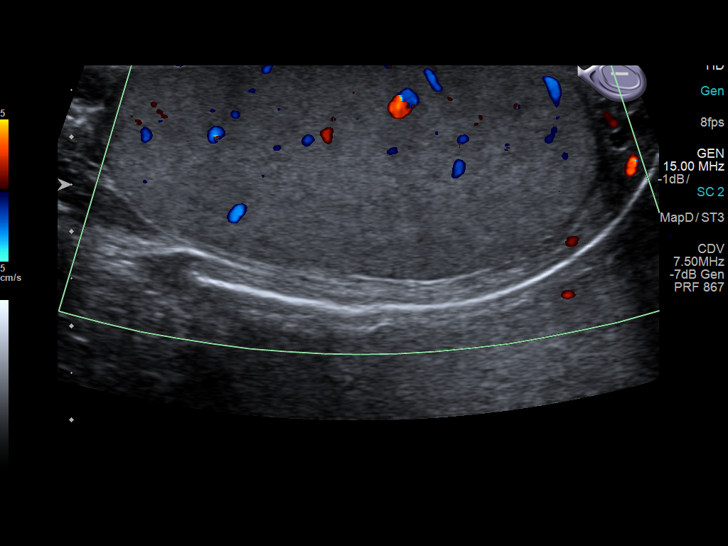
[im 42/78]
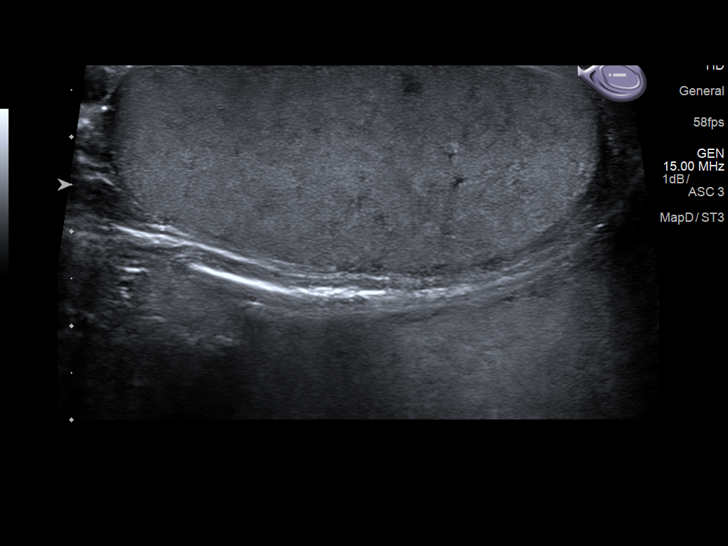
[im 49/78]
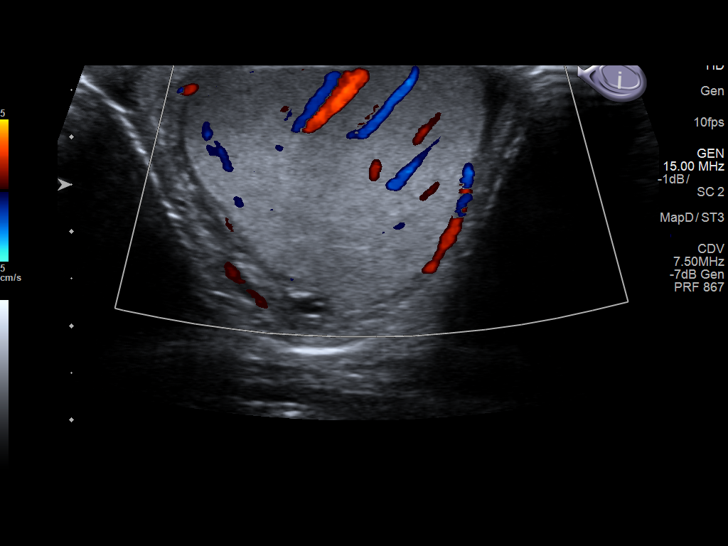
[im 52/78]
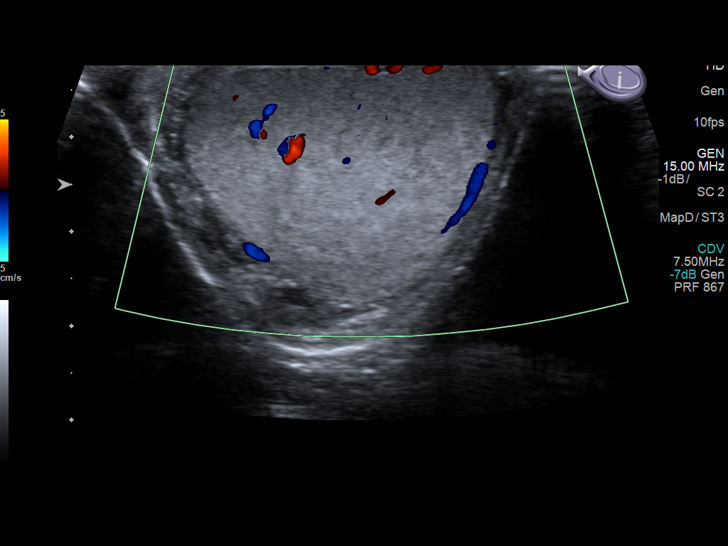
[im 58/78]
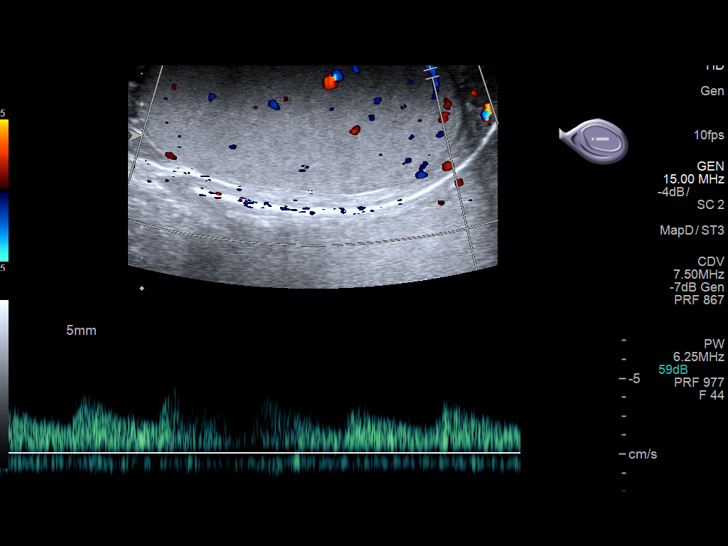
[im 65/78]
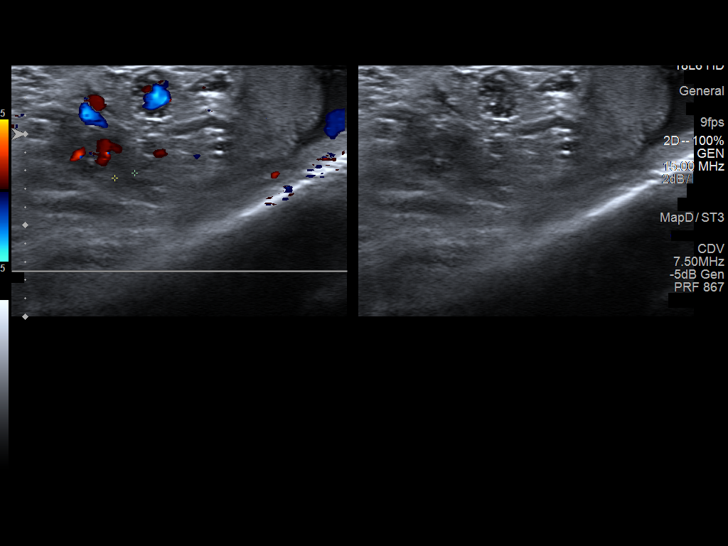
[im 71/78]
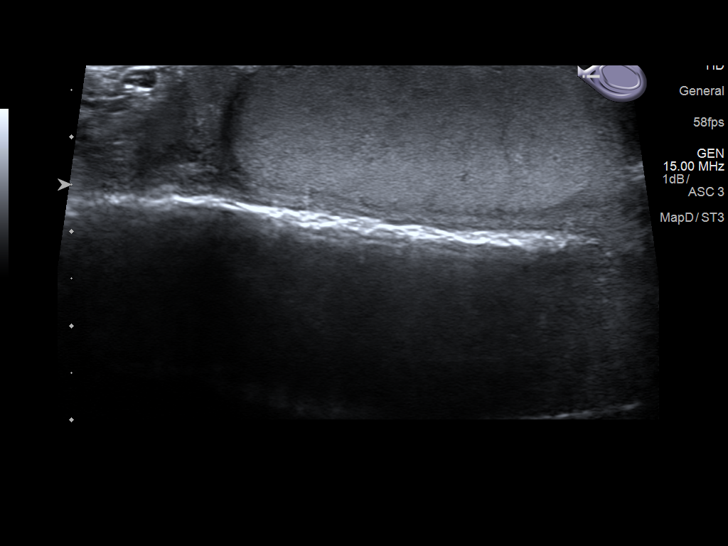
[im 78/78]
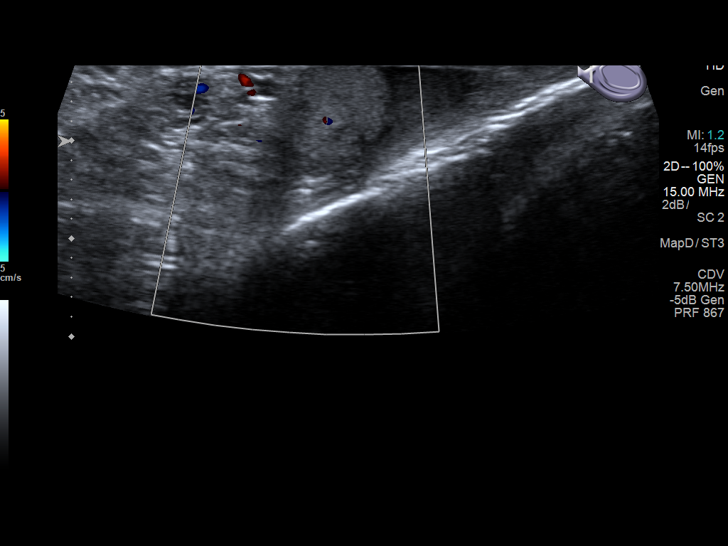

[14 of 25 positions shown; findings below may reference images not displayed]

FINDINGS: Right testicle

Measurements: 4.8 x 2.6 x 3.6 cm. No mass or microlithiasis
visualized.

Left testicle

Measurements: 5.1 x 2.4 x 3.2 cm. No mass or microlithiasis
visualized.

Right epididymis:  Normal in size and appearance.

Left epididymis:  Normal in size and appearance.

Hydrocele:  None visualized.

Varicocele:  None visualized.

Pulsed Doppler interrogation of both testes demonstrates normal low
resistance arterial and venous waveforms bilaterally.
IMPRESSION: Normal scrotal ultrasound. No evidence of testicular mass, hematoma
or torsion.

## 2020-07-11 ENCOUNTER — Ambulatory Visit
Admission: EM | Admit: 2020-07-11 | Discharge: 2020-07-11 | Disposition: A | Discharge status: Home / Self Care | Payer: Commercial Managed Care - PPO | Attending: Family Medicine | Admitting: Family Medicine

## 2020-07-11 ENCOUNTER — Other Ambulatory Visit: Payer: Self-pay

## 2020-07-11 DIAGNOSIS — B349 Viral infection, unspecified: Secondary | ICD-10-CM | POA: Insufficient documentation

## 2020-07-11 DIAGNOSIS — U071 COVID-19: Secondary | ICD-10-CM | POA: Diagnosis not present

## 2020-07-11 DIAGNOSIS — Z20822 Contact with and (suspected) exposure to covid-19: Secondary | ICD-10-CM

## 2020-07-11 LAB — SARS CORONAVIRUS 2 (TAT 6-24 HRS): SARS Coronavirus 2: POSITIVE — AB

## 2020-07-11 MED ORDER — ONDANSETRON 4 MG PO TBDP: 4.0000 mg | ORAL_TABLET | Freq: Three times a day (TID) | ORAL | 0 refills | Status: DC | PRN

## 2020-07-11 MED ORDER — KETOROLAC TROMETHAMINE 10 MG PO TABS: 10.0000 mg | ORAL_TABLET | Freq: Four times a day (QID) | ORAL | 0 refills | Status: DC | PRN

## 2020-07-11 NOTE — ED Triage Notes (Signed)
Patient complains of body aches, fever, chills and fatigue and cough x 6pm.

## 2020-07-11 NOTE — ED Provider Notes (Signed)
[facility]    CSN: [id number] Arrival date & time: 07/11/20  0846  History   Chief Complaint Chief Complaint  Patient presents with  . Generalized Body Aches   HPI   30 year old male presents with multiple complaints/symptoms.  Patient reports that he has been sick since yesterday.  Reports sore throat, fever, nausea, abdominal pain, body aches, headache, chills, fatigue, cough.  No known direct contact with COVID-19.  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  Rates his pain is 8/10 in severity.  Pain described as achy.  No relieving factors.  No other complaints.  Past Medical History:  Diagnosis Date  . Asthma    Past Surgical History:  Procedure Laterality Date  . BICEPS TENDON REPAIR Right   . nose fracture repair    . SHOULDER SURGERY Right   . thumb tendon     repair  . TONSILLECTOMY         Home Medications    Prior to Admission medications   Medication Sig Start Date End Date Taking? Authorizing Provider  ketorolac (TORADOL) 10 MG tablet Take 1 tablet (10 mg total) by mouth every 6 (six) hours as needed for moderate pain or severe pain. 07/11/20  Yes Brealyn Baril G, DO  ondansetron (ZOFRAN ODT) 4 MG disintegrating tablet Take 1 tablet (4 mg total) by mouth every 8 (eight) hours as needed for nausea or vomiting. 07/11/20  Yes Tommie Sams, DO    Family History Family History  Problem Relation Age of Onset  . Healthy Mother   . Healthy Father     Social History Social History   Tobacco Use  . Smoking status: Never Smoker  . Smokeless tobacco: Current User    Types: Snuff  Vaping Use  . Vaping Use: Never used  Substance Use Topics  . Alcohol use: Yes    Alcohol/week: 0.0 standard drinks  . Drug use: No     Allergies   Patient has no known allergies.   Review of Systems Review of Systems Per HPI  Physical Exam Triage Vital Signs ED Triage Vitals  Enc Vitals Group     BP 07/11/20 1012 109/83     Pulse  Rate 07/11/20 1012 (!) 120     Resp 07/11/20 1012 16     Temp 07/11/20 1012 100 F (37.8 C)     Temp Source 07/11/20 1012 Oral     SpO2 07/11/20 1012 97 %     Weight 07/11/20 0946 205 lb (93 kg)     Height 07/11/20 0946 6\' 2"  (1.88 m)     Head Circumference --      Peak Flow --      Pain Score 07/11/20 0946 8     Pain Loc --      Pain Edu? --    Updated Vital Signs BP 109/83 (BP Location: Left Arm)   Pulse (!) 120   Temp 100 F (37.8 C) (Oral)   Resp 16   Ht 6\' 2"  (1.88 m)   Wt 93 kg   SpO2 97%   BMI 26.32 kg/m   Visual Acuity Right Eye Distance:   Left Eye Distance:   Bilateral Distance:    Right Eye Near:   Left Eye Near:    Bilateral Near:     Physical Exam Constitutional:      General: He is not in acute distress.    Appearance: He is ill-appearing.  HENT:     Head: Normocephalic and atraumatic.  Eyes:     General:        Right eye: No discharge.        Left eye: No discharge.     Conjunctiva/sclera: Conjunctivae normal.  Cardiovascular:     Rate and Rhythm: Regular rhythm. Tachycardia present.  Pulmonary:     Effort: Pulmonary effort is normal.     Breath sounds: Normal breath sounds. No wheezing, rhonchi or rales.  Neurological:     Mental Status: He is alert.  Psychiatric:        Mood and Affect: Mood normal.        Behavior: Behavior normal.    UC Treatments / Results  Labs (all labs ordered are listed, but only abnormal results are displayed) Labs Reviewed  SARS CORONAVIRUS 2 (TAT 6-24 HRS)    EKG   Radiology No results found.  Procedures Procedures (including critical care time)  Medications Ordered in UC Medications - No data to display  Initial Impression / Assessment and Plan / UC Course  I have reviewed the triage vital signs and the nursing notes.  Pertinent labs & imaging results that were available during my care of the patient were reviewed by me and considered in my medical decision making (see chart for details).     30 year old male presents with a viral illness.  Patient is currently having systemic symptoms and has had ongoing fever.  Suspected COVID-19.  Toradol for body aches.  Zofran for nausea.  Supportive care.  Awaiting Covid test results.  Final Clinical Impressions(s) / UC Diagnoses   Final diagnoses:  Viral illness  Suspected COVID-19 virus infection     Discharge Instructions     Medication as prescribed.  Stay home.  Check my chart for COVID test results.  Take care  Dr. Adriana Simas     ED Prescriptions    Medication Sig Dispense Auth. Provider   ketorolac (TORADOL) 10 MG tablet Take 1 tablet (10 mg total) by mouth every 6 (six) hours as needed for moderate pain or severe pain. 20 tablet Kaiel Weide G, DO   ondansetron (ZOFRAN ODT) 4 MG disintegrating tablet Take 1 tablet (4 mg total) by mouth every 8 (eight) hours as needed for nausea or vomiting. 20 tablet Tommie Sams, DO     PDMP not reviewed this encounter.   Tommie Sams, DO 07/11/20 1200

## 2020-07-11 NOTE — Discharge Instructions (Signed)
Medication as prescribed.  Stay home.  Check my chart for COVID test results.  Take care  Dr. Sim Choquette   

## 2021-05-02 ENCOUNTER — Ambulatory Visit
Admission: EM | Admit: 2021-05-02 | Discharge: 2021-05-02 | Disposition: A | Discharge status: Home / Self Care | Payer: Commercial Managed Care - PPO | Attending: Physician Assistant | Admitting: Physician Assistant

## 2021-05-02 ENCOUNTER — Other Ambulatory Visit: Payer: Self-pay

## 2021-05-02 ENCOUNTER — Ambulatory Visit (INDEPENDENT_AMBULATORY_CARE_PROVIDER_SITE_OTHER): Payer: Commercial Managed Care - PPO

## 2021-05-02 DIAGNOSIS — R1084 Generalized abdominal pain: Secondary | ICD-10-CM | POA: Diagnosis not present

## 2021-05-02 DIAGNOSIS — R1010 Upper abdominal pain, unspecified: Secondary | ICD-10-CM | POA: Diagnosis not present

## 2021-05-02 DIAGNOSIS — K219 Gastro-esophageal reflux disease without esophagitis: Secondary | ICD-10-CM | POA: Insufficient documentation

## 2021-05-02 DIAGNOSIS — K5900 Constipation, unspecified: Secondary | ICD-10-CM | POA: Diagnosis present

## 2021-05-02 LAB — CBC WITH DIFFERENTIAL/PLATELET
Abs Immature Granulocytes: 0.01 10*3/uL (ref 0.00–0.07)
Basophils Absolute: 0.1 10*3/uL (ref 0.0–0.1)
Basophils Relative: 1 %
Eosinophils Absolute: 0.5 10*3/uL (ref 0.0–0.5)
Eosinophils Relative: 6 %
HCT: 44.6 % (ref 39.0–52.0)
Hemoglobin: 15.8 g/dL (ref 13.0–17.0)
Immature Granulocytes: 0 %
Lymphocytes Relative: 40 %
Lymphs Abs: 3.3 10*3/uL (ref 0.7–4.0)
MCH: 31.3 pg (ref 26.0–34.0)
MCHC: 35.4 g/dL (ref 30.0–36.0)
MCV: 88.3 fL (ref 80.0–100.0)
Monocytes Absolute: 0.6 10*3/uL (ref 0.1–1.0)
Monocytes Relative: 7 %
Neutro Abs: 3.9 10*3/uL (ref 1.7–7.7)
Neutrophils Relative %: 46 %
Platelets: 190 10*3/uL (ref 150–400)
RBC: 5.05 MIL/uL (ref 4.22–5.81)
RDW: 12.2 % (ref 11.5–15.5)
WBC: 8.4 10*3/uL (ref 4.0–10.5)
nRBC: 0 % (ref 0.0–0.2)

## 2021-05-02 LAB — COMPREHENSIVE METABOLIC PANEL
ALT: 23 U/L (ref 0–44)
AST: 23 U/L (ref 15–41)
Albumin: 4.7 g/dL (ref 3.5–5.0)
Alkaline Phosphatase: 60 U/L (ref 38–126)
Anion gap: 8 (ref 5–15)
BUN: 12 mg/dL (ref 6–20)
CO2: 29 mmol/L (ref 22–32)
Calcium: 9.6 mg/dL (ref 8.9–10.3)
Chloride: 101 mmol/L (ref 98–111)
Creatinine, Ser: 0.98 mg/dL (ref 0.61–1.24)
GFR, Estimated: >60 mL/min (ref >60)
Glucose, Bld: 91 mg/dL (ref 70–99)
Potassium: 3.8 mmol/L (ref 3.5–5.1)
Sodium: 138 mmol/L (ref 135–145)
Total Bilirubin: 1.2 mg/dL (ref 0.3–1.2)
Total Protein: 7.8 g/dL (ref 6.5–8.1)

## 2021-05-02 LAB — LIPASE, BLOOD: Lipase: 46 U/L (ref 11–51)

## 2021-05-02 MED ORDER — SUCRALFATE 1 GM/10ML PO SUSP: 1.0000 g | Freq: Three times a day (TID) | ORAL | 0 refills | Status: DC

## 2021-05-02 MED ORDER — ALUM & MAG HYDROXIDE-SIMETH 200-200-20 MG/5ML PO SUSP
30.0000 mL | Freq: Once | ORAL | Status: AC
  Administered 2021-05-02: 30 mL via ORAL

## 2021-05-02 MED ORDER — LIDOCAINE VISCOUS HCL 2 % MT SOLN
15.0000 mL | Freq: Once | OROMUCOSAL | Status: AC
  Administered 2021-05-02: 15 mL via ORAL

## 2021-05-02 MED ORDER — PANTOPRAZOLE SODIUM 40 MG PO TBEC: 40.0000 mg | DELAYED_RELEASE_TABLET | Freq: Every day | ORAL | 0 refills | Status: DC

## 2021-05-02 NOTE — ED Triage Notes (Signed)
Pt here stating that he has been having upper abdominal pain for 3 days. Pt states that he is unable to sleep because of the pain. It is constant pain. LBM-thinks it was yesterday.

## 2021-05-02 NOTE — Discharge Instructions (Signed)
-  Your labs are perfect.  Your x-ray shows some backed up stool in the area where you are having pain.  I believe your symptoms are due to a combination of acid reflux flareup and constipation. -I have sent 2 different medications to help with acid reflux and I advised to purchase over-the-counter MiraLAX or other stool softener and increase fluid intake to help with the constipation. -If you develop a fever or any severe abdominal pain you should go to emergency department to have further imaging performed. -If symptoms continue follow-up with PCP.

## 2021-05-02 NOTE — ED Provider Notes (Signed)
[facility]    CSN: [id number] Arrival date & time: 05/02/21  1407      History   Chief Complaint Chief Complaint  Patient presents with   Abdominal Pain    HPI Shaun Gibbs is a 30 y.o. male presenting for 3-day history of upper abdominal pain.  He says pain is mostly in the epigastric region but does radiate across the upper abdomen.  He describes the pain as "burning" and constant.  Pain is worse with eating or drinking.  He actually says it is initially improved with eating or drinking and then the pain worsens afterward.  No associated fevers, nausea, vomiting or diarrhea.  He does report a reduced appetite.  He also reports change in BMs.  He says he had a BM yesterday but it was like "deer droppings."  He says he took a medication that starts with a C that his wife gave him to help with constipation.  He also says he took Tums.  He said the Tums ease the pain for about 10 minutes.  He has a history of GERD.  Patient does not report any heavy alcohol use but says he did have 2 IPAs yesterday when he was watching the game.  Denies any contact with anyone having similar symptoms.  No history of any other GI issues besides GERD.  No other complaints.  HPI  Past Medical History:  Diagnosis Date   Asthma     There are no problems to display for this patient.   Past Surgical History:  Procedure Laterality Date   BICEPS TENDON REPAIR Right    nose fracture repair     SHOULDER SURGERY Right    thumb tendon     repair   TONSILLECTOMY         Home Medications    Prior to Admission medications   Medication Sig Start Date End Date Taking? Authorizing Provider  pantoprazole (PROTONIX) 40 MG tablet Take 1 tablet (40 mg total) by mouth daily for 14 days. 05/02/21 05/16/21 Yes Eusebio Friendly B, PA-C  sucralfate (CARAFATE) 1 GM/10ML suspension Take 10 mLs (1 g total) by mouth 4 (four) times daily -  with meals and at bedtime. 05/02/21  Yes Eusebio Friendly B,  PA-C  ketorolac (TORADOL) 10 MG tablet Take 1 tablet (10 mg total) by mouth every 6 (six) hours as needed for moderate pain or severe pain. 07/11/20   Tommie Sams, DO  ondansetron (ZOFRAN ODT) 4 MG disintegrating tablet Take 1 tablet (4 mg total) by mouth every 8 (eight) hours as needed for nausea or vomiting. 07/11/20   Tommie Sams, DO    Family History Family History  Problem Relation Age of Onset   Healthy Mother    Healthy Father     Social History Social History   Tobacco Use   Smoking status: Never   Smokeless tobacco: Current    Types: Snuff  Vaping Use   Vaping Use: Never used  Substance Use Topics   Alcohol use: Yes    Alcohol/week: 0.0 standard drinks   Drug use: No     Allergies   Patient has no known allergies.   Review of Systems Review of Systems  Constitutional:  Positive for appetite change. Negative for fatigue and fever.  HENT:  Negative for congestion and rhinorrhea.   Respiratory:  Negative for cough and shortness of breath.   Cardiovascular:  Negative for chest pain.  Gastrointestinal:  Positive for abdominal pain and  constipation. Negative for abdominal distention, blood in stool, diarrhea, nausea and vomiting.  Genitourinary:  Negative for dysuria.  Musculoskeletal:  Negative for back pain.  Neurological:  Negative for dizziness and weakness.    Physical Exam Triage Vital Signs ED Triage Vitals  Enc Vitals Group     BP 05/02/21 1454 125/87     Pulse Rate 05/02/21 1454 85     Resp 05/02/21 1454 18     Temp 05/02/21 1454 98.4 F (36.9 C)     Temp Source 05/02/21 1454 Oral     SpO2 05/02/21 1454 98 %     Weight 05/02/21 1453 205 lb (93 kg)     Height 05/02/21 1453 6\' 2"  (1.88 m)     Head Circumference --      Peak Flow --      Pain Score 05/02/21 1453 10     Pain Loc --      Pain Edu? --      Excl. in GC? --    No data found.  Updated Vital Signs BP 125/87 (BP Location: Left Arm)   Pulse 85   Temp 98.4 F (36.9 C) (Oral)    Resp 18   Ht 6\' 2"  (1.88 m)   Wt 205 lb (93 kg)   SpO2 98%   BMI 26.32 kg/m       Physical Exam Vitals and nursing note reviewed.  Constitutional:      General: He is not in acute distress.    Appearance: Normal appearance. He is well-developed. He is not ill-appearing.  HENT:     Head: Normocephalic and atraumatic.  Eyes:     General: No scleral icterus.    Conjunctiva/sclera: Conjunctivae normal.  Cardiovascular:     Rate and Rhythm: Normal rate and regular rhythm.     Pulses: Normal pulses.     Heart sounds: Normal heart sounds.  Pulmonary:     Effort: Pulmonary effort is normal. No respiratory distress.     Breath sounds: Normal breath sounds.  Abdominal:     Palpations: Abdomen is soft.     Tenderness: There is abdominal tenderness in the right upper quadrant, epigastric area, periumbilical area and left upper quadrant. There is guarding (epigastric). There is no rebound.  Musculoskeletal:     Cervical back: Neck supple.  Skin:    General: Skin is warm and dry.  Neurological:     General: No focal deficit present.     Mental Status: He is alert. Mental status is at baseline.     Motor: No weakness.     Coordination: Coordination normal.     Gait: Gait normal.  Psychiatric:        Mood and Affect: Mood normal.        Behavior: Behavior normal.        Thought Content: Thought content normal.     UC Treatments / Results  Labs (all labs ordered are listed, but only abnormal results are displayed) Labs Reviewed  CBC WITH DIFFERENTIAL/PLATELET  COMPREHENSIVE METABOLIC PANEL  LIPASE, BLOOD    EKG   Radiology DG Abdomen 1 View  Result Date: 05/02/2021 CLINICAL DATA:  Upper abdominal pain for 3 days. EXAM: ABDOMEN - 1 VIEW COMPARISON:  None. FINDINGS: The lung bases are clear. The abdominal bowel gas pattern is unremarkable. No findings for obstruction perforation. The soft tissue shadows are maintained. No worrisome calcifications. The bony structures are  unremarkable. IMPRESSION: Unremarkable abdominal radiographs. Electronically Signed   By: .  Gallerani M.D.   On: 05/02/2021 15:22    Procedures Procedures (including critical care time)  Medications Ordered in UC Medications  alum & mag hydroxide-simeth (MAALOX/MYLANTA) 200-200-20 MG/5ML suspension 30 mL (30 mLs Oral Given 05/02/21 1610)    And  lidocaine (XYLOCAINE) 2 % viscous mouth solution 15 mL (15 mLs Oral Given 05/02/21 1610)    Initial Impression / Assessment and Plan / UC Course  I have reviewed the triage vital signs and the nursing notes.  Pertinent labs & imaging results that were available during my care of the patient were reviewed by me and considered in my medical decision making (see chart for details).  30 year old male presenting for 3-day history of mostly upper abdominal pain.  Vitals normal and stable and he is overall well-appearing.  He does have tenderness palpation the epigastric, right upper quadrant and left upper quadrant as well as periumbilical region.  Appears to have the most tenderness at the epigastric region.  Some mild guarding in the epigastrium.  No rebound.  KUB ordered today to assess for constipation.  KUB does show large stool burden of the transverse colon.  No obstruction.  CBC, CMP and lipase ordered to assess for any sign of infection.  All labs reviewed by me.  All labs are within normal limits.  Patient has been given GI cocktail and says that his pain came down from 10 out of 10 to 6 out of 10.  Advised patient and his presentation is consistent with combination of constipation and acid reflux flareup.  Advised increasing rest fluid intake.  I have sent in pantoprazole and Carafate.  Advised to start MiraLAX.  Reviewed return and ED precautions and when to follow-up with PCP.   Final Clinical Impressions(s) / UC Diagnoses   Final diagnoses:  Generalized abdominal pain  Gastroesophageal reflux disease, unspecified whether  esophagitis present  Constipation, unspecified constipation type     Discharge Instructions      -Your labs are perfect.  Your x-ray shows some backed up stool in the area where you are having pain.  I believe your symptoms are due to a combination of acid reflux flareup and constipation. -I have sent 2 different medications to help with acid reflux and I advised to purchase over-the-counter MiraLAX or other stool softener and increase fluid intake to help with the constipation. -If you develop a fever or any severe abdominal pain you should go to emergency department to have further imaging performed. -If symptoms continue follow-up with PCP.     ED Prescriptions     Medication Sig Dispense Auth. Provider   pantoprazole (PROTONIX) 40 MG tablet Take 1 tablet (40 mg total) by mouth daily for 14 days. 14 tablet Eusebio Friendly B, PA-C   sucralfate (CARAFATE) 1 GM/10ML suspension Take 10 mLs (1 g total) by mouth 4 (four) times daily -  with meals and at bedtime. 420 mL Shirlee Latch, PA-C      PDMP not reviewed this encounter.   Shirlee Latch, PA-C 05/02/21 1649

## 2022-04-27 ENCOUNTER — Telehealth: Payer: Self-pay | Admitting: Family Medicine

## 2022-04-27 NOTE — Telephone Encounter (Signed)
Copied from [place] [id number]. Topic: Appointment Scheduling - Scheduling Inquiry for Clinic >> Apr 27, 2022 12:03 PM Chapman Fitch wrote: Reason for CRM: [facility] called and stated that the Pt will discharge today from [facility] and needs a 1 week hosp f/u / pt hasnt seen Dr. Ronnald Ramp since March of 2020/ please advise and call pt

## 2022-07-04 IMAGING — CR DG ABDOMEN 1V
2 series · 2 of 2 positions shown · non-contrast
Comparison: None.

CLINICAL DATA: Upper abdominal pain for 3 days.

EXAM:
ABDOMEN - 1 VIEW

[abdomen kub (1 of 2)]
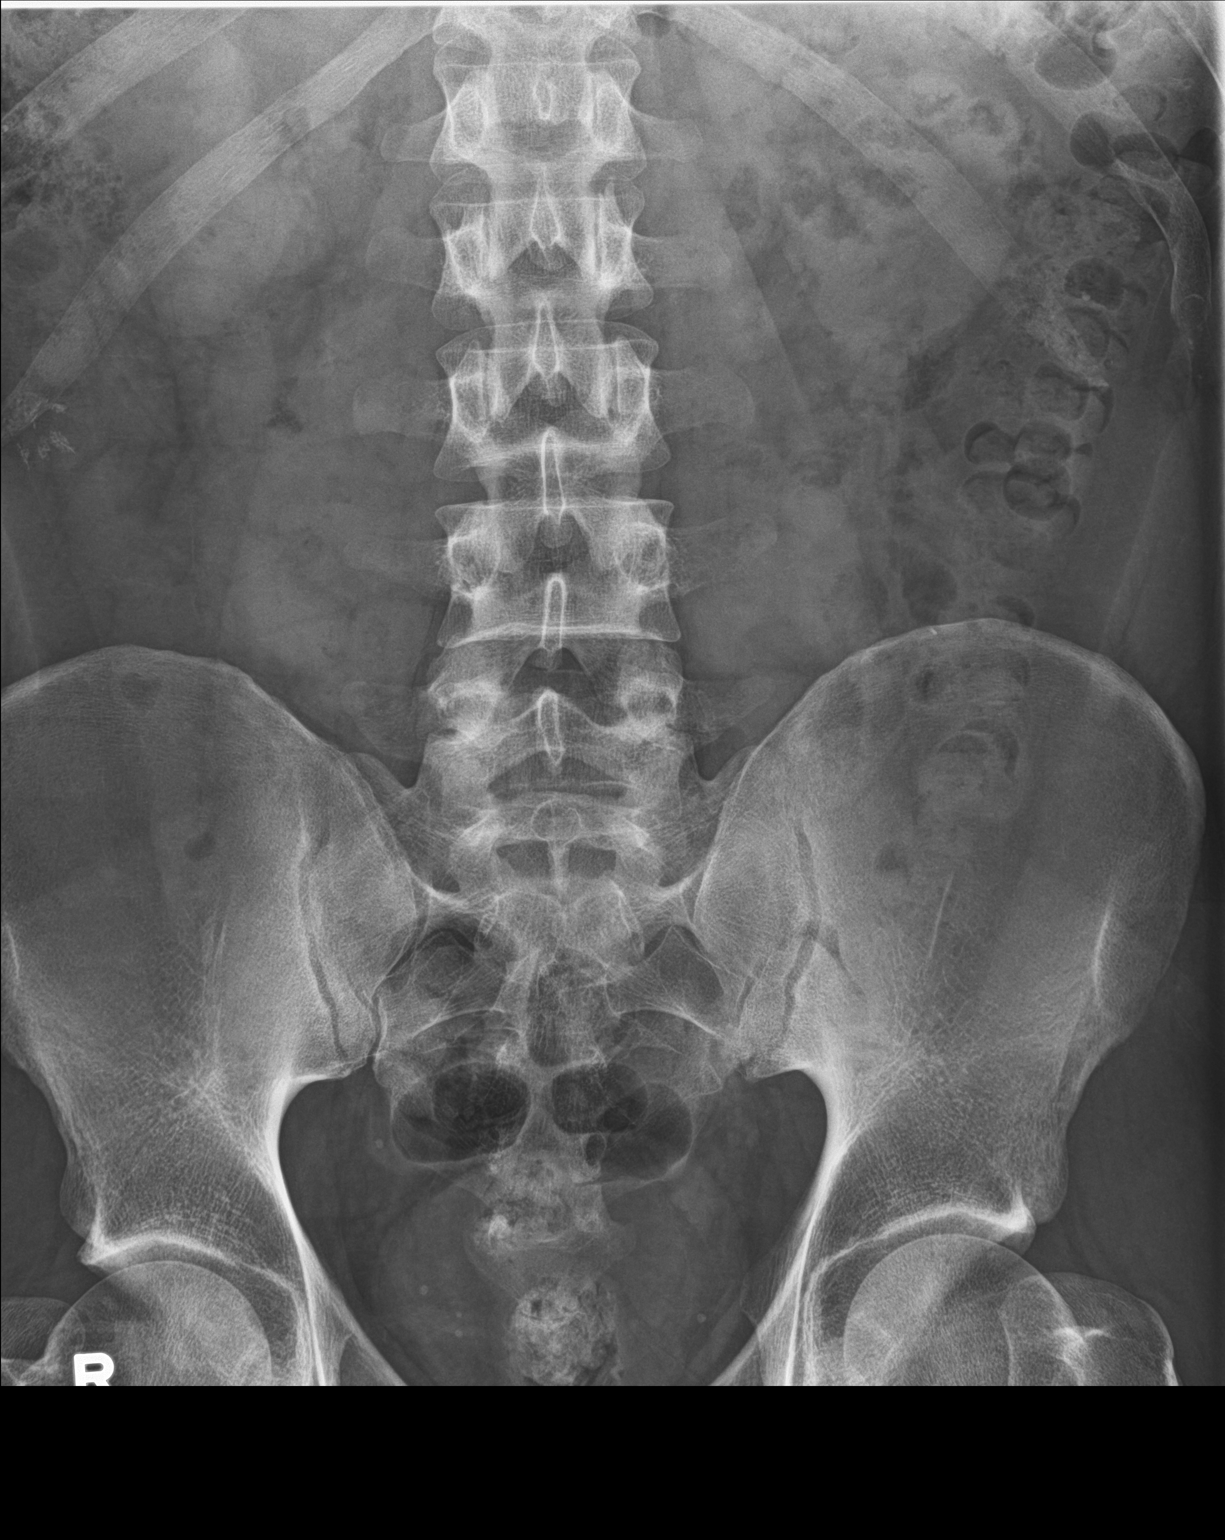

[abdomen kub (2 of 2)]
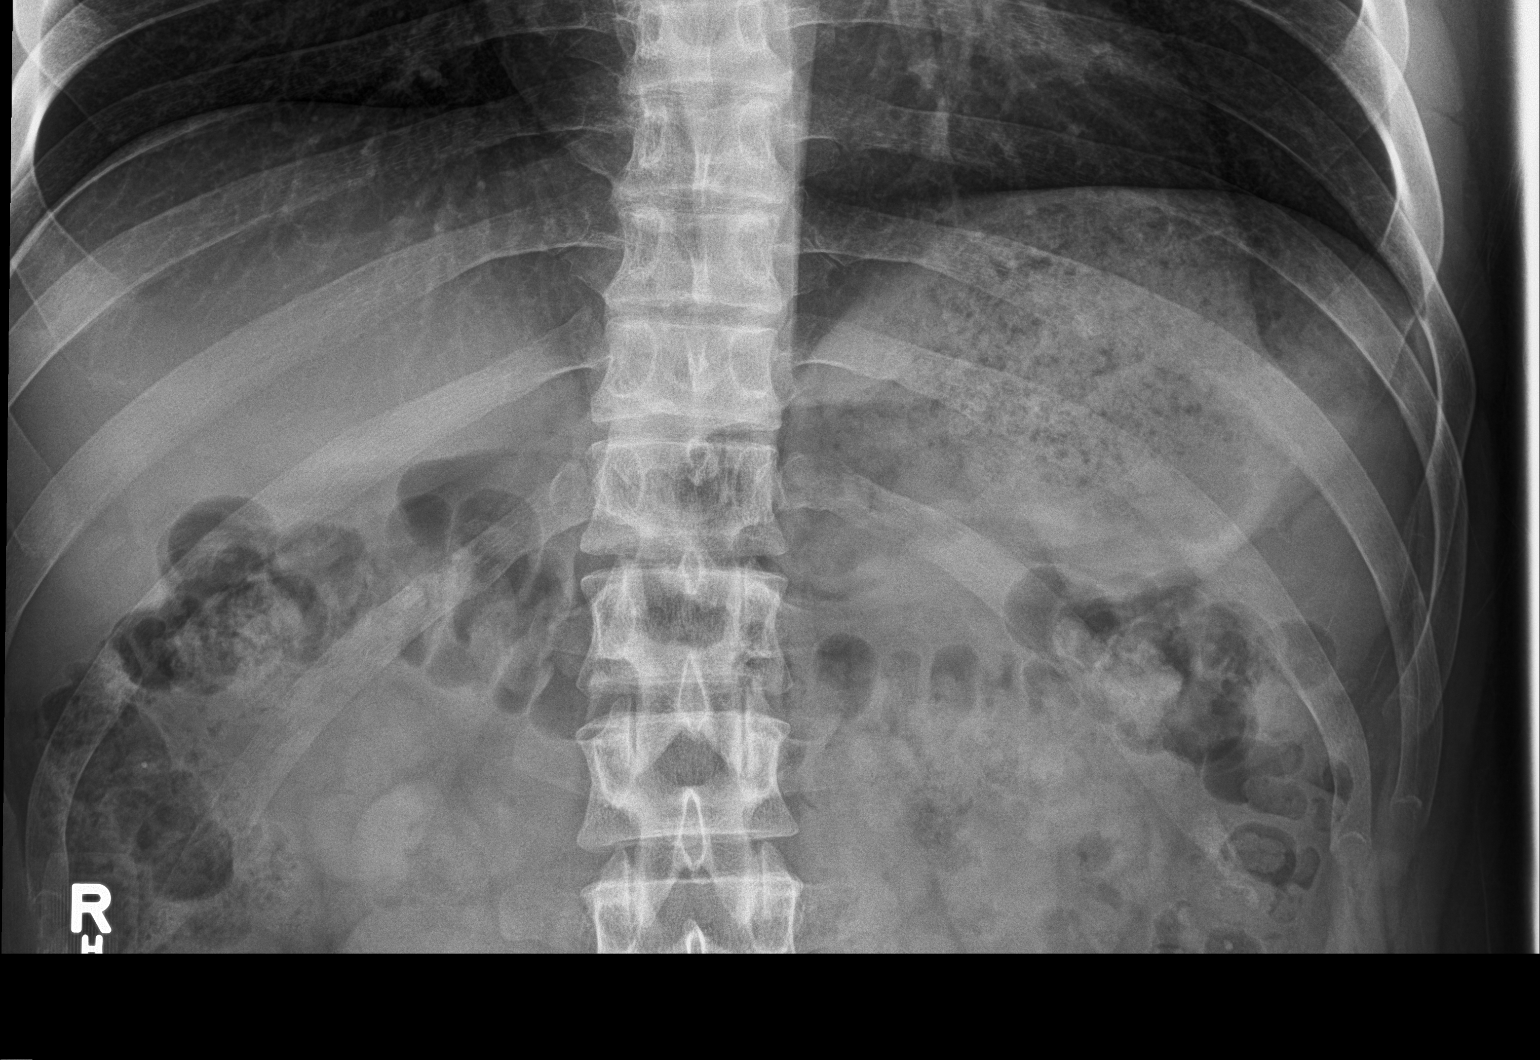

[2 of 2 positions shown; findings below may reference images not displayed]

FINDINGS: The lung bases are clear.

The abdominal bowel gas pattern is unremarkable. No findings for
obstruction perforation. The soft tissue shadows are maintained. No
worrisome calcifications. The bony structures are unremarkable.
IMPRESSION: Unremarkable abdominal radiographs.

## 2022-08-27 ENCOUNTER — Encounter: Payer: Self-pay | Admitting: Family Medicine

## 2022-08-27 ENCOUNTER — Ambulatory Visit: Payer: BC Managed Care – PPO | Admitting: Family Medicine

## 2022-08-27 VITALS — BP 110/78 | HR 84 | Ht 74.0 in | Wt 213.0 lb

## 2022-08-27 DIAGNOSIS — H7293 Unspecified perforation of tympanic membrane, bilateral: Secondary | ICD-10-CM | POA: Diagnosis not present

## 2022-08-27 DIAGNOSIS — Z87448 Personal history of other diseases of urinary system: Secondary | ICD-10-CM

## 2022-08-27 DIAGNOSIS — G587 Mononeuritis multiplex: Secondary | ICD-10-CM

## 2022-08-27 DIAGNOSIS — J020 Streptococcal pharyngitis: Secondary | ICD-10-CM | POA: Diagnosis not present

## 2022-08-27 DIAGNOSIS — K760 Fatty (change of) liver, not elsewhere classified: Secondary | ICD-10-CM

## 2022-08-27 DIAGNOSIS — K200 Eosinophilic esophagitis: Secondary | ICD-10-CM

## 2022-08-27 DIAGNOSIS — J029 Acute pharyngitis, unspecified: Secondary | ICD-10-CM | POA: Diagnosis not present

## 2022-08-27 DIAGNOSIS — H6523 Chronic serous otitis media, bilateral: Secondary | ICD-10-CM

## 2022-08-27 LAB — POCT RAPID STREP A (OFFICE): Rapid Strep A Screen: POSITIVE — AB

## 2022-08-27 LAB — POC COVID19 BINAXNOW: SARS Coronavirus 2 Ag: NEGATIVE

## 2022-08-27 MED ORDER — AMOXICILLIN 500 MG PO CAPS: 500.0000 mg | ORAL_CAPSULE | Freq: Three times a day (TID) | ORAL | 0 refills | Status: AC

## 2022-08-27 NOTE — Progress Notes (Signed)
Date:  08/27/2022   Name:  Shaun Gibbs   DOB:  [birth year]   MRN:  [id number]   Chief Complaint: Establish Care and Generalized Body Aches (No fever, sore throat, yellow production)  Patient is a 32 year old male who presents for a establish care exam. The patient reports the following problems: all of the above. Health maintenance has been reviewed up to date.    Fever  This is a new problem. The current episode started yesterday. His temperature was unmeasured prior to arrival. Associated symptoms include congestion, muscle aches and a sore throat. Pertinent negatives include no abdominal pain, chest pain, coughing, diarrhea, ear pain, headaches, nausea, rash, urinary pain or wheezing.    Lab Results  Component Value Date   NA 138 05/02/2021   K 3.8 05/02/2021   CO2 29 05/02/2021   GLUCOSE 91 05/02/2021   BUN 12 05/02/2021   CREATININE 0.98 05/02/2021   CALCIUM 9.6 05/02/2021   GFRNONAA >60 05/02/2021   No results found for: "CHOL", "HDL", "LDLCALC", "LDLDIRECT", "TRIG", "CHOLHDL" No results found for: "TSH" No results found for: "HGBA1C" Lab Results  Component Value Date   WBC 8.4 05/02/2021   HGB 15.8 05/02/2021   HCT 44.6 05/02/2021   MCV 88.3 05/02/2021   PLT 190 05/02/2021   Lab Results  Component Value Date   ALT 23 05/02/2021   AST 23 05/02/2021   ALKPHOS 60 05/02/2021   BILITOT 1.2 05/02/2021   No results found for: "25OHVITD2", "25OHVITD3", "VD25OH"   Review of Systems  Constitutional:  Positive for fever. Negative for chills.  HENT:  Positive for congestion and sore throat. Negative for drooling, ear discharge and ear pain.   Respiratory:  Negative for cough, shortness of breath and wheezing.   Cardiovascular:  Negative for chest pain, palpitations and leg swelling.  Gastrointestinal:  Negative for abdominal pain, blood in stool, constipation, diarrhea and nausea.  Endocrine: Negative for polydipsia.  Genitourinary:  Negative for  dysuria, frequency, hematuria and urgency.  Musculoskeletal:  Negative for back pain, myalgias and neck pain.  Skin:  Negative for rash.  Allergic/Immunologic: Negative for environmental allergies.  Neurological:  Negative for dizziness and headaches.  Hematological:  Does not bruise/bleed easily.  Psychiatric/Behavioral:  Negative for suicidal ideas. The patient is not nervous/anxious.     There are no problems to display for this patient.   Allergies  Allergen Reactions   Duloxetine Other (See Comments)    Past Surgical History:  Procedure Laterality Date   BICEPS TENDON REPAIR Right    nose fracture repair     SHOULDER SURGERY Right    thumb tendon     repair   TONSILLECTOMY      Social History   Tobacco Use   Smoking status: Never   Smokeless tobacco: Current    Types: Snuff  Vaping Use   Vaping Use: Never used  Substance Use Topics   Alcohol use: Not Currently   Drug use: No     Medication list has been reviewed and updated.  Current Meds  Medication Sig   omeprazole (PRILOSEC) 40 MG capsule Take 20 mg by mouth daily. otc   [DISCONTINUED] oxyCODONE-acetaminophen (PERCOCET/ROXICET) 5-325 MG tablet Take 1 tablet by mouth 3 (three) times daily as needed.       08/27/2022    2:44 PM  GAD 7 : Generalized Anxiety Score  Nervous, Anxious, on Edge 0  Control/stop worrying 0  Worry too much - different things 0  Trouble relaxing 0  Restless 0  Easily annoyed or irritable 0  Afraid - awful might happen 0  Total GAD 7 Score 0  Anxiety Difficulty Not difficult at all       08/27/2022    2:43 PM 08/02/2017    3:54 PM 09/24/2015    1:59 PM  Depression screen PHQ 2/9  Decreased Interest 0 0 0  Down, Depressed, Hopeless 0 0 0  PHQ - 2 Score 0 0 0  Altered sleeping 0 0   Tired, decreased energy 0 0   Change in appetite 0 0   Feeling bad or failure about yourself  0 0   Trouble concentrating 0 0   Moving slowly or fidgety/restless 0 0   Suicidal thoughts  0 0   PHQ-9 Score 0 0   Difficult doing work/chores Not difficult at all      BP Readings from Last 3 Encounters:  08/27/22 110/78  05/02/21 125/87  07/11/20 109/83    Physical Exam Vitals and nursing note reviewed.  HENT:     Head: Normocephalic.     Right Ear: Tympanic membrane and external ear normal.     Left Ear: Tympanic membrane and external ear normal.     Nose: Nose normal.  Eyes:     General: No scleral icterus.       Right eye: No discharge.        Left eye: No discharge.     Conjunctiva/sclera: Conjunctivae normal.     Pupils: Pupils are equal, round, and reactive to light.  Neck:     Thyroid: No thyromegaly.     Vascular: No JVD.     Trachea: No tracheal deviation.  Cardiovascular:     Rate and Rhythm: Normal rate and regular rhythm.     Heart sounds: Normal heart sounds. No murmur heard.    No friction rub. No gallop.  Pulmonary:     Effort: No respiratory distress.     Breath sounds: Normal breath sounds. No wheezing, rhonchi or rales.  Abdominal:     General: Bowel sounds are normal.     Palpations: Abdomen is soft. There is no mass.     Tenderness: There is no abdominal tenderness. There is no guarding or rebound.  Musculoskeletal:        General: No tenderness. Normal range of motion.     Cervical back: Normal range of motion and neck supple.  Lymphadenopathy:     Cervical: No cervical adenopathy.  Skin:    General: Skin is warm.     Findings: No rash.  Neurological:     Mental Status: He is alert and oriented to person, place, and time.     Cranial Nerves: No cranial nerve deficit.     Deep Tendon Reflexes: Reflexes are normal and symmetric.     Wt Readings from Last 3 Encounters:  08/27/22 213 lb (96.6 kg)  05/02/21 205 lb (93 kg)  07/11/20 205 lb (93 kg)    BP 110/78   Pulse 84   Ht '6\' 2"'$  (1.88 m)   Wt 213 lb (96.6 kg)   SpO2 96%   BMI 27.35 kg/m   Assessment and Plan:  1. Pharyngitis, unspecified etiology New onset.   Persistent for 24 hours.  Exam extremely erythematous pharyngitis.  Will obtain rapid strep. - POC COVID-19 - CBC with Differential/Platelet  2. Strep pharyngitis Rapid strep noted to be positive.  Will treat with amoxicillin 500 mg 3 times a day.  Will check CBC because of bilateral tympanic membranes the head appeared to have perforations.  Will recheck in 3 weeks. - POCT rapid strep A - amoxicillin (AMOXIL) 500 MG capsule; Take 1 capsule (500 mg total) by mouth 3 (three) times daily for 10 days.  Dispense: 30 capsule; Refill: 0 - CBC with Differential/Platelet  3. Perforation of both tympanic membranes As noted above  4. Bilateral chronic serous otitis media As noted above  5. Eosinophilic esophagitis Patient has history of eosinophilic esophagitis.  I am not certain as to what the follow-up to this would be.  We will call gastroenterology to see if this patient needs follow-up for this condition.  6. Mononeuritis multiplex Chronic.  Followed by neurology at [facility]. - Comprehensive metabolic panel  7. Hepatic steatosis Chronic.  Will obtain comprehensive metabolic panel for evaluation of transaminases and patient has been given low-cholesterol low triglyceride dietary guidelines. - Comprehensive metabolic panel  8. History of nephritis Patient has a history of nephritis and will obtain a microscopic urinalysis to look for red cell casts. - Urinalysis, microscopic only    Otilio Miu, MD

## 2022-08-28 LAB — CBC WITH DIFFERENTIAL/PLATELET
Basophils Absolute: 0.1 10*3/uL (ref 0.0–0.2)
Basos: 0 %
EOS (ABSOLUTE): 0.1 10*3/uL (ref 0.0–0.4)
Eos: 1 %
Hematocrit: 47.7 % (ref 37.5–51.0)
Hemoglobin: 16.4 g/dL (ref 13.0–17.7)
Immature Grans (Abs): 0 10*3/uL (ref 0.0–0.1)
Immature Granulocytes: 0 %
Lymphocytes Absolute: 2.5 10*3/uL (ref 0.7–3.1)
Lymphs: 18 %
MCH: 30.9 pg (ref 26.6–33.0)
MCHC: 34.4 g/dL (ref 31.5–35.7)
MCV: 90 fL (ref 79–97)
Monocytes Absolute: 0.9 10*3/uL (ref 0.1–0.9)
Monocytes: 7 %
Neutrophils Absolute: 10.5 10*3/uL — ABNORMAL HIGH (ref 1.4–7.0)
Neutrophils: 74 %
Platelets: 151 10*3/uL (ref 150–450)
RBC: 5.3 x10E6/uL (ref 4.14–5.80)
RDW: 13 % (ref 11.6–15.4)
WBC: 14.2 10*3/uL — ABNORMAL HIGH (ref 3.4–10.8)

## 2022-08-28 LAB — COMPREHENSIVE METABOLIC PANEL
ALT: 33 IU/L (ref 0–44)
AST: 22 IU/L (ref 0–40)
Albumin/Globulin Ratio: 2.3 — ABNORMAL HIGH (ref 1.2–2.2)
Albumin: 5.2 g/dL — ABNORMAL HIGH (ref 4.1–5.1)
Alkaline Phosphatase: 87 IU/L (ref 44–121)
BUN/Creatinine Ratio: 11 (ref 9–20)
BUN: 11 mg/dL (ref 6–20)
Bilirubin Total: 1.3 mg/dL — ABNORMAL HIGH (ref 0.0–1.2)
CO2: 23 mmol/L (ref 20–29)
Calcium: 10.1 mg/dL (ref 8.7–10.2)
Chloride: 99 mmol/L (ref 96–106)
Creatinine, Ser: 0.98 mg/dL (ref 0.76–1.27)
Globulin, Total: 2.3 g/dL (ref 1.5–4.5)
Glucose: 88 mg/dL (ref 70–99)
Potassium: 4.3 mmol/L (ref 3.5–5.2)
Sodium: 139 mmol/L (ref 134–144)
Total Protein: 7.5 g/dL (ref 6.0–8.5)
eGFR: 105 mL/min/{1.73_m2} (ref >59)

## 2022-08-28 LAB — URINALYSIS, MICROSCOPIC ONLY
Bacteria, UA: NONE SEEN
Casts: NONE SEEN /lpf
WBC, UA: NONE SEEN /hpf (ref 0–5)

## 2022-08-28 NOTE — Patient Instructions (Signed)
GUIDELINES FOR  LOW-CHOLESTEROL, LOW-TRIGLYCERIDE DIETS    FOODS TO USE   MEATS, FISH Choose lean meats (chicken, turkey, veal, and non-fatty cuts of beef with excess fat trimmed; one serving = 3 oz of cooked meat). Also, fresh or frozen fish, canned fish packed in water, and shellfish (lobster, crabs, shrimp, and oysters). Limit use to no more than one serving of one of these per week. Shellfish are high in cholesterol but low in saturated fat and should be used sparingly. Meats and fish should be broiled (pan or oven) or baked on a rack.  EGGS Egg substitutes and egg whites (use freely). Egg yolks (limit two per week).  FRUITS Eat three servings of fresh fruit per day (1 serving =  cup). Be sure to have at least one citrus fruit daily. Frozen and canned fruit with no sugar or syrup added may be used.  VEGETABLES Most vegetables are not limited (see next page). One dark-green (string beans, escarole) or one deep yellow (squash) vegetable is recommended daily. Cauliflower, broccoli, and celery, as well as potato skins, are recommended for their fiber content. (Fiber is associated with cholesterol reduction) It is preferable to steam vegetables, but they may be boiled, strained, or braised with polyunsaturated vegetable oil (see below).  BEANS Dried peas or beans (1 serving =  cup) may be used as a bread substitute.  NUTS Almonds, walnuts, and peanuts may be used sparingly  (1 serving = 1 Tablespoonful). Use pumpkin, sesame, or sunflower seeds.  BREADS, GRAINS One roll or one slice of whole grain or enriched bread may be used, or three soda crackers or four pieces of melba toast as a substitute. Spaghetti, rice or noodles ( cup) or  large ear of corn may be used as a bread substitute. In preparing these foods do not use butter or shortening, use soft margarine. Also use egg and sugar substitutes.  Choose high fiber grains, such as oats and whole wheat.  CEREALS Use  cup of hot cereal or  cup of  cold cereal per day. Add a sugar substitute if desired, with 99% fat free or skim milk.  MILK PRODUCTS Always use 99% fat free or skim milk, dairy products such as low fat cheeses (farmer's uncreamed diet cottage), low-fat yogurt, and powdered skim milk.  FATS, OILS Use soft (not stick) margarine; vegetable oils that are high in polyunsaturated fats (such as safflower, sunflower, soybean, corn, and cottonseed). Always refrigerate meat drippings to harden the fat and remove it before preparing gravies  DESSERTS, SNACKS Limit to two servings per day; substitute each serving for a bread/cereal serving: ice milk, water sherbet (1/4 cup); unflavored gelatin or gelatin flavored with sugar substitute (1/3 cup); pudding prepared with skim milk (1/2 cup); egg white souffls; unbuttered popcorn (1  cups). Substitute carob for chocolate.  BEVERAGES Fresh fruit juices (limit 4 oz per day); black coffee, plain or herbal teas; soft drinks with sugar substitutes; club soda, preferably salt-free; cocoa made with skim milk or nonfat dried milk and water (sugar substitute added if desired); clear broth. Alcohol: limit two servings per day (see second page).  MISCELLANEOUS  You may use the following freely: vinegar, spices, herbs, nonfat bouillon, mustard, Worcestershire sauce, soy sauce, flavoring essence.                  GUIDELINES FOR  LOW-CHOLESTEROL, LOW TRIGLYCERIDE DIETS    FOODS TO AVOID   MEATS, FISH Marbled beef, pork, bacon, sausage, and other pork products; fatty   fowl (duck, goose); skin and fat of turkey and chicken; processed meats; luncheon meats (salami, bologna); frankfurters and fast-food hamburgers (theyre loaded with fat); organ meats (kidneys, liver); canned fish packed in oil.  EGGS Limit egg yolks to two per week.   FRUITS Coconuts (rich in saturated fats).  VEGETABLES Avoid avocados. Starchy vegetables (potatoes, corn, lima beans, dried peas, beans) may be used only if  substitutes for a serving of bread or cereal. (Baked potato skin, however, is desirable for its fiber content.  BEANS Commercial baked beans with sugar and/or pork added.  NUTS Avoid nuts.  Limit peanuts and walnuts to one tablespoonful per day.  BREADS, GRAINS Any baked goods with shortening and/or sugar. Commercial mixes with dried eggs and whole milk. Avoid sweet rolls, doughnuts, breakfast pastries (Danish), and sweetened packaged cereals (the added sugar converts readily to triglycerides).  MILK PRODUCTS Whole milk and whole-milk packaged goods; cream; ice cream; whole-milk puddings, yogurt, or cheeses; nondairy cream substitutes.  FATS, OILS Butter, lard, animal fats, bacon drippings, gravies, cream sauces as well as palm and coconut oils. All these are high in saturated fats. Examine labels on cholesterol free products for hydrogenated fats. (These are oils that have been hardened into solids and in the process have become saturated.)  DESSERTS, SNACKS Fried snack foods like potato chips; chocolate; candies in general; jams, jellies, syrups; whole- milk puddings; ice cream and milk sherbets; hydrogenated peanut butter.  BEVERAGES Sugared fruit juices and soft drinks; cocoa made with whole milk and/or sugar. When using alcohol (1 oz liquor, 5 oz beer, or 2  oz dry table wine per serving), one serving must be substituted for one bread or cereal serving (limit, two servings of alcohol per day).   SPECIAL NOTES    Remember that even non-limited foods should be used in moderation. While on a cholesterol-lowering diet, be sure to avoid animal fats and marbled meats. 3. While on a triglyceride-lowering diet, be sure to avoid sweets and to control the amount of carbohydrates you eat (starchy foods such as flour, bread, potatoes).While on a tri-glyceride-lowering diet, be sure to avoid sweets Buy a good low-fat cookbook, such as the one published by the American Heart Association. Consult your physician  if you have any questions.               [facility] Low Glycemic Diet Plan   Low Glycemic Foods (20-49) Moderate Glycemic Foods (50-69) High Glycemic Foods (70-100)      Breakfast Creals Breakfast Cereals Breakfast Cereals  All Bran All-Bran Fruit'n Oats   Bran Buds Bran Chex   Cheerios Corn chex    Fiber One Oatmeal (not instant)   Just Right Mini-Wheats   Corn Flakes Cream of Wheat    Oat Bran Special K Swiss Muesli   Grape Nuts Grape Nut Flakes      Grits Nutri-Grain    Fruits and fruit juice: Fruits Puffed Rice Puffed Wheat    (Limit to 1-2 Servings per day) Banana (under-ride) Dates   Rice Chex Rice Krispies    Apples Apricots (fresh/dried)   Figs Grapes   Shredded Wheat Team    Blackberries Blueberries   Kiwi Mango   Total     Cherries Cranberries   Oranges Raisins     Peaches Pears    Fruits  Plums Prunes   Fruit Juices Pineapple Watermelon    Grapefruit Raspberries   Cranberry Juice Orange Juice   Banana (over-ripe)     Strawberries Tangerines        Apple Juice Grapefruit Juice   Beans and Legumes Beverages  Tomato Juice    Boston-type baked beans Sodas, sweet tea, pineapple juice   Canned pinto, kidney, or navy beans   Beans and Legumes (fresh-cooked) Green peas Vegetables  Black-eyed peas Butter Beans    Potato, baked, boiled, fried, mashed  Chick peas Lentils   Vegetables French fries  Green beans Lima beans   Beets Carrots   Canned or frozen corn  Kidney beans Navy beans   Sweet potato Yam   Parsnips  Pinto beans Snow peas   Corn on the cob Winter squash      Non-starchy vegetables Grains Breads  Asparagus, avocado, broccoli, cabbage Cornmeal Rice, brown   Most breads (white and whole grain)  cauliflower, celery, cucumber, greens Rice, white Couscous   Bagels Bread sticks    lettuce, mushrooms, peppers, tomatoes  Bread stuffing Kaiser roll    okra, onions, spinach, summer squash Pasta Dinner rolls   Macaroni  Pizza, cheese     Grains Ravioli, meat filled Spaghetti, white   Grains  Barley Bulgur    Rice, instant Tapioca, with milk    Rye Wild rice   Nuts    Cashews Macadamia   Candy and most cookies  Nuts and oils    Almonds, peanuts, sunflower seeds Snacks Snacks  hazelnuts, pecans, walnuts Chocolate Ice cream, lowfat   Donuts Corn chips    Oils that are liquid at room temperature Muffin Popcorn   Jelly beans Pretzels      Pastries  Dairy, fish, meat, soy, and eggs    Milk, skim Lowfat cheese    Restaurant and ethnic foods  Yogurt, lowfat, fruit sugar sweetened  Most Chinese food (sugar in stir fry    or wok sauce)  Lean red meat Fish    Teriyaki-style meats and vegetables  Skinless chicken and turkey, shellfish        Egg whites (up to 3 daily), Soy Products    Egg yolks (up to 7 or _____ per week)      

## 2022-09-17 ENCOUNTER — Ambulatory Visit: Payer: BC Managed Care – PPO | Admitting: Family Medicine

## 2022-09-18 ENCOUNTER — Encounter: Payer: Self-pay | Admitting: Family Medicine

## 2022-11-17 ENCOUNTER — Ambulatory Visit
Admission: EM | Admit: 2022-11-17 | Discharge: 2022-11-17 | Disposition: A | Discharge status: Home / Self Care | Payer: Commercial Managed Care - PPO | Attending: Emergency Medicine | Admitting: Emergency Medicine

## 2022-11-17 DIAGNOSIS — J029 Acute pharyngitis, unspecified: Secondary | ICD-10-CM | POA: Diagnosis present

## 2022-11-17 DIAGNOSIS — R519 Headache, unspecified: Secondary | ICD-10-CM | POA: Diagnosis present

## 2022-11-17 DIAGNOSIS — Z1152 Encounter for screening for COVID-19: Secondary | ICD-10-CM | POA: Insufficient documentation

## 2022-11-17 DIAGNOSIS — J45909 Unspecified asthma, uncomplicated: Secondary | ICD-10-CM | POA: Diagnosis not present

## 2022-11-17 LAB — SARS CORONAVIRUS 2 BY RT PCR: SARS Coronavirus 2 by RT PCR: NEGATIVE

## 2022-11-17 LAB — GROUP A STREP BY PCR: Group A Strep by PCR: NOT DETECTED

## 2022-11-17 MED ORDER — KETOROLAC TROMETHAMINE 30 MG/ML IJ SOLN: 30.0000 mg | Freq: Once | INTRAMUSCULAR | Status: DC

## 2022-11-17 MED ORDER — DICLOFENAC SODIUM 50 MG PO TBEC: 50.0000 mg | DELAYED_RELEASE_TABLET | Freq: Two times a day (BID) | ORAL | 0 refills | Status: AC

## 2022-11-17 NOTE — ED Provider Notes (Signed)
[facility]    CSN: [id number] Arrival date & time: 11/17/22  0800      History   Chief Complaint Chief Complaint  Patient presents with   Headache   Sore Throat    HPI Shaun Gibbs is a 32 y.o. male.   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  He was treated for strep pharyngitis by his PCP on 08/27/2022 with a 10-day course of amoxicillin.  He reports that after he finished the course of amoxicillin the strep returned.  He tested positive for strep 1 week ago and was prescribed amoxicillin again.  The headaches started right after he began taking his second round of amoxicillin and for a while were being triggered by the urge to use the bathroom, both urination and defecation.  That has resolved but he has a lingering headache behind his eyes.  Patient also reports that he has had a runny nose for the past week.  He denies any fever.  Additionally, when he started his second round of amoxicillin, he developed hematuria that lasted 1 day.  He was seen by his new PCP, [facility], and had lab work and was given Toradol for his headache and IV fluids.  He has not had a return of hematuria and he denies any urinary symptoms.  Past Medical History:  Diagnosis Date   Asthma    Neuromuscular disorder (HCC)     There are no problems to display for this patient.   Past Surgical History:  Procedure Laterality Date   BICEPS TENDON REPAIR Right    nose fracture repair     SHOULDER SURGERY Right    thumb tendon     repair   TONSILLECTOMY         Home Medications    Prior to Admission medications   Medication Sig Start Date End Date Taking? Authorizing Provider  amoxicillin (AMOXIL) 875 MG tablet SMARTSIG:1 Tablet(s) By Mouth Every 12 Hours 11/08/22  Yes [provider]   diclofenac (VOLTAREN) 50 MG EC tablet Take 1 tablet (50 mg total) by mouth 2 (two) times daily for 10 days. 11/17/22 11/27/22 Yes Becky Augusta, NP  omeprazole (PRILOSEC) 40 MG capsule Take 20 mg by mouth daily. otc 04/27/22   [provider]    Family History Family History  Problem Relation Age of Onset   Hypertension Mother    Healthy Mother    Healthy Father    Heart disease Paternal Grandfather     Social History Social History   Tobacco Use   Smoking status: Never   Smokeless tobacco: Current    Types: Snuff  Vaping Use   Vaping Use: Never used  Substance Use Topics   Alcohol use: Not Currently   Drug use: No     Allergies   Duloxetine   Review of Systems Review of Systems  Constitutional:  Negative for fever.  HENT:  Positive for rhinorrhea and sore throat.   Neurological:  Positive for headaches.     Physical Exam Triage Vital Signs ED Triage Vitals  Enc Vitals Group     BP      Pulse      Resp      Temp      Temp src      SpO2  Weight      Height      Head Circumference      Peak Flow      Pain Score      Pain Loc      Pain Edu?      Excl. in GC?    No data found.  Updated Vital Signs BP 115/76 (BP Location: Left Arm)   Pulse (!) 59   Temp 98.5 F (36.9 C)   Resp 18   SpO2 95%   Visual Acuity Right Eye Distance:   Left Eye Distance:   Bilateral Distance:    Right Eye Near:   Left Eye Near:    Bilateral Near:     Physical Exam Vitals and nursing note reviewed.  Constitutional:      Appearance: Normal appearance. He is not ill-appearing.  HENT:     Head: Normocephalic and atraumatic.     Mouth/Throat:     Mouth: Mucous membranes are moist.     Pharynx: Posterior oropharyngeal erythema present. No oropharyngeal exudate.     Comments: Tonsillar pillars are surgically absent but there is erythema to the soft palate and uvula.  Posterior oropharynx is unremarkable. Skin:    General: Skin is warm and dry.      Capillary Refill: Capillary refill takes less than 2 seconds.     Findings: No erythema, lesion or rash.  Neurological:     General: No focal deficit present.     Mental Status: He is alert and oriented to person, place, and time.      UC Treatments / Results  Labs (all labs ordered are listed, but only abnormal results are displayed) Labs Reviewed  GROUP A STREP BY PCR  SARS CORONAVIRUS 2 BY RT PCR    EKG   Radiology No results found.  Procedures Procedures (including critical care time)  Medications Ordered in UC Medications - No data to display  Initial Impression / Assessment and Plan / UC Course  I have reviewed the triage vital signs and the nursing notes.  Pertinent labs & imaging results that were available during my care of the patient were reviewed by me and considered in my medical decision making (see chart for details).   Patient is a nontoxic-appearing 32 year old male with a complex past medical history as outlined in HPI above presenting for reevaluation following 2 weeks with sore throat and headache.  He reports that he is currently on amoxicillin for strep infection though those records are unavailable to [place].  He was diagnosed with strep 1 week ago.  His headache has been present for 2 weeks and describes as a dull headache behind his eyes at this time.  He is also had a runny nose for the past week.  Despite the fact that his symptoms have been going on for prolonged length of time I will order a COVID PCR to rule out the presence of concomitant COVID infection.  I will also order a strep PCR to evaluate for the continued presence of strep even in the setting of amoxicillin.  My concern is that the amoxicillin did not completely eradicate the strep infection and he needs a stronger antibiotic such as Augmentin or cefdinir.  I will also order 30 mg of IM Toradol to help with the patient's headache.  Reports that the patient declined the Toradol injection.   Patient reported to me during the history intake that he received Toradol IV at his PCPs office for his headache.  Per  staff report he indicated that IM Toradol does not work for him.  Strep PCR is negative.  COVID PCR is negative.  Etiology of patient's sore throat and headache is unclear.  With the recent development in the last week of runny nose it is possible that the patient has developed a viral URI on top of his strep pharyngitis that he is currently taking amoxicillin for.  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  I will recommend that he follow-up with Dr. Mayford Knife, [facility] PCP at the wellness center.  I will send the patient home on diclofenac twice daily to see if it upset his headache and bodyaches.   Final Clinical Impressions(s) / UC Diagnoses   Final diagnoses:  Pharyngitis, unspecified etiology  Intractable episodic headache, unspecified headache type     Discharge Instructions      The etiology of your headache and bodyaches, and your sore throat, are unclear but you tested negative for both strep and COVID today.  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  You can try taking the diclofenac 50 mg twice daily with food to see if it helps with your headache and bodyaches.  I will make an appointment to follow-up with Dr. Mayford Knife regarding your ongoing symptoms and possible referral to long COVID clinic, neurology, or pain management as needed.     ED Prescriptions     Medication Sig Dispense Auth. Provider   diclofenac (VOLTAREN) 50 MG EC tablet Take 1 tablet (50 mg total) by mouth 2 (two) times daily for 10 days. 20 tablet Becky Augusta, NP      PDMP not reviewed this encounter.   Becky Augusta, NP 11/17/22 [phone]

## 2022-11-17 NOTE — ED Triage Notes (Signed)
Patient presents to UC for sore throat and HA x 2 weeks. Tested positive for strep twice. Taking amoxicillin.

## 2022-11-17 NOTE — Discharge Instructions (Addendum)
The etiology of your headache and bodyaches, and your sore throat, are unclear but you tested negative for both strep and COVID today.  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  You can try taking the diclofenac 50 mg twice daily with food to see if it helps with your headache and bodyaches.  I will make an appointment to follow-up with Dr. Mayford Knife regarding your ongoing symptoms and possible referral to long COVID clinic, neurology, or pain management as needed.

## 2022-11-17 NOTE — ED Notes (Signed)
Pt declined the Toradol injection. Pt states that it does not work for him.  Provider has been informed.

## 2023-02-05 ENCOUNTER — Emergency Department
Admission: EM | Admit: 2023-02-05 | Discharge: 2023-02-05 | Disposition: A | Discharge status: Home / Self Care | Payer: Commercial Managed Care - PPO | Attending: Emergency Medicine | Admitting: Emergency Medicine

## 2023-02-05 ENCOUNTER — Emergency Department: Payer: Commercial Managed Care - PPO

## 2023-02-05 ENCOUNTER — Other Ambulatory Visit: Payer: Self-pay

## 2023-02-05 DIAGNOSIS — J45909 Unspecified asthma, uncomplicated: Secondary | ICD-10-CM | POA: Diagnosis not present

## 2023-02-05 DIAGNOSIS — Z20822 Contact with and (suspected) exposure to covid-19: Secondary | ICD-10-CM | POA: Diagnosis not present

## 2023-02-05 DIAGNOSIS — R0789 Other chest pain: Secondary | ICD-10-CM | POA: Diagnosis present

## 2023-02-05 DIAGNOSIS — R079 Chest pain, unspecified: Secondary | ICD-10-CM

## 2023-02-05 DIAGNOSIS — R519 Headache, unspecified: Secondary | ICD-10-CM | POA: Diagnosis not present

## 2023-02-05 DIAGNOSIS — J40 Bronchitis, not specified as acute or chronic: Secondary | ICD-10-CM

## 2023-02-05 LAB — BASIC METABOLIC PANEL
Anion gap: 8 (ref 5–15)
BUN: 12 mg/dL (ref 6–20)
CO2: 25 mmol/L (ref 22–32)
Calcium: 9.4 mg/dL (ref 8.9–10.3)
Chloride: 101 mmol/L (ref 98–111)
Creatinine, Ser: 0.95 mg/dL (ref 0.61–1.24)
GFR, Estimated: >60 mL/min (ref >60)
Glucose, Bld: 104 mg/dL — ABNORMAL HIGH (ref 70–99)
Potassium: 3.7 mmol/L (ref 3.5–5.1)
Sodium: 134 mmol/L — ABNORMAL LOW (ref 135–145)

## 2023-02-05 LAB — CBC
HCT: 45.5 % (ref 39.0–52.0)
Hemoglobin: 16.7 g/dL (ref 13.0–17.0)
MCH: 31 pg (ref 26.0–34.0)
MCHC: 36.7 g/dL — ABNORMAL HIGH (ref 30.0–36.0)
MCV: 84.6 fL (ref 80.0–100.0)
Platelets: 154 10*3/uL (ref 150–400)
RBC: 5.38 MIL/uL (ref 4.22–5.81)
RDW: 12.2 % (ref 11.5–15.5)
WBC: 7.3 10*3/uL (ref 4.0–10.5)
nRBC: 0 % (ref 0.0–0.2)

## 2023-02-05 LAB — SARS CORONAVIRUS 2 BY RT PCR: SARS Coronavirus 2 by RT PCR: NEGATIVE

## 2023-02-05 LAB — TROPONIN I (HIGH SENSITIVITY): Troponin I (High Sensitivity): 4 ng/L (ref <18)

## 2023-02-05 MED ORDER — CYCLOBENZAPRINE HCL 5 MG PO TABS: 5.0000 mg | ORAL_TABLET | Freq: Three times a day (TID) | ORAL | 0 refills | Status: AC | PRN

## 2023-02-05 MED ORDER — ACETAMINOPHEN 500 MG PO TABS
1000.0000 mg | ORAL_TABLET | Freq: Once | ORAL | Status: AC
  Administered 2023-02-05: 1000 mg via ORAL
  Filled 2023-02-05: qty 2

## 2023-02-05 MED ORDER — AZITHROMYCIN 250 MG PO TABS: ORAL_TABLET | ORAL | 0 refills | Status: DC

## 2023-02-05 MED ORDER — CYCLOBENZAPRINE HCL 10 MG PO TABS: 10.0000 mg | ORAL_TABLET | Freq: Once | ORAL | Status: DC

## 2023-02-05 MED ORDER — KETOROLAC TROMETHAMINE 30 MG/ML IJ SOLN
30.0000 mg | Freq: Once | INTRAMUSCULAR | Status: AC
  Administered 2023-02-05: 30 mg via INTRAVENOUS
  Filled 2023-02-05: qty 1

## 2023-02-05 MED ORDER — GABAPENTIN 300 MG PO CAPS: 300.0000 mg | ORAL_CAPSULE | Freq: Three times a day (TID) | ORAL | 0 refills | Status: AC

## 2023-02-05 MED ORDER — METOCLOPRAMIDE HCL 10 MG PO TABS: 10.0000 mg | ORAL_TABLET | Freq: Three times a day (TID) | ORAL | 0 refills | Status: AC

## 2023-02-05 MED ORDER — ALPRAZOLAM 0.5 MG PO TABS
0.5000 mg | ORAL_TABLET | Freq: Once | ORAL | Status: AC
  Administered 2023-02-05: 0.5 mg via ORAL
  Filled 2023-02-05: qty 1

## 2023-02-05 MED ORDER — METOCLOPRAMIDE HCL 5 MG/ML IJ SOLN
10.0000 mg | Freq: Once | INTRAMUSCULAR | Status: AC
  Administered 2023-02-05: 10 mg via INTRAVENOUS
  Filled 2023-02-05: qty 2

## 2023-02-05 MED ORDER — BENZONATATE 100 MG PO CAPS: ORAL_CAPSULE | ORAL | 0 refills | Status: AC

## 2023-02-05 MED ORDER — MORPHINE SULFATE (PF) 4 MG/ML IV SOLN
4.0000 mg | Freq: Once | INTRAVENOUS | Status: AC
  Administered 2023-02-05: 4 mg via INTRAVENOUS
  Filled 2023-02-05: qty 1

## 2023-02-05 MED ORDER — DIPHENHYDRAMINE HCL 50 MG/ML IJ SOLN
25.0000 mg | Freq: Once | INTRAMUSCULAR | Status: AC
  Administered 2023-02-05: 25 mg via INTRAVENOUS
  Filled 2023-02-05: qty 1

## 2023-02-05 MED ORDER — SODIUM CHLORIDE 0.9 % IV BOLUS
1000.0000 mL | Freq: Once | INTRAVENOUS | Status: AC
  Administered 2023-02-05: 1000 mL via INTRAVENOUS

## 2023-02-05 NOTE — Discharge Instructions (Addendum)
Your exam, labs, EKG, and chest x-ray are normal and reassuring at this time.  The exact source for your fever is not known at this time.  You had a negative COVID test and no x-ray evidence of any acute pneumonia.  Take OTC Tylenol as needed for additional fever relief.  Take the prescription nausea medicine along with OTC Benadryl for additional headache management.  Take the gabapentin as prescribed, with the suggested long ramp-up to the appropriate dose.  Follow-up with your primary provider for ongoing evaluation.  Return to the ED if necessary.

## 2023-02-05 NOTE — ED Triage Notes (Signed)
Pt here via [facility] with cp. Pt was diaphoretic on ems arrival, pain is left sided and sharp in nature. Pt has hx of neuropathy in legs. Pt also felt lightheaded while lying on the stretcher with ems. Pt had a little SOB.  [id number]-cbg

## 2023-02-05 NOTE — ED Provider Notes (Signed)
[facility] Emergency Department Provider Note     Event Date/Time   First MD Initiated Contact with Patient 02/05/23 1832     (approximate)   History   Chest Pain   HPI  Shaun Gibbs is a 32 y.o. male presents to the ED for evaluation of chest pain.  Patient with a history of asthma, presents via EMS from home.  According to EMS patient was diaphoretic on their arrival.  Patient describes left-sided chest pain that is sharp in nature.  He also gives a history of neuropathy to his legs.  Patient feels lightheaded at times when lying on the stretcher and EMS.  Patient denies any nausea, vomiting, cough, shortness of breath.  Chart review reveals an extensive history of specialist evaluation for persistent intermittent neuropathic pain into the lower extremities.  Patient would report symptoms began after he received COVID-vaccine 2 years ago.  Patient's father and wife are present in the room and advised some additional history.  Physical Exam   Triage Vital Signs: ED Triage Vitals [02/05/23 1639]  Encounter Vitals Group     BP 111/83     Systolic BP Percentile      Diastolic BP Percentile      Pulse Rate 98     Resp 16     Temp (!) 100.6 F (38.1 C)     Temp Source Oral     SpO2 95 %     Weight 185 lb (83.9 kg)     Height 6\' 2"  (1.88 m)     Head Circumference      Peak Flow      Pain Score      Pain Loc      Pain Education      Exclude from Growth Chart     Most recent vital signs: Vitals:   02/05/23 2055 02/05/23 2100  BP:  109/75  Pulse: 80 67  Resp:  20  Temp: 99.8 F (37.7 C)   SpO2: 94% 94%    General Awake, no distress. Anxious, but no acute distress HEENT NCAT. PERRL. EOMI. No rhinorrhea. Mucous membranes are moist.  CV:  Good peripheral perfusion. RRR RESP:  Normal effort. CTA ABD:  No distention.  Soft and nontender.  No rebound, guarding, or rigidity. MSK:  Spinal alignment without midline tenderness, spasm,  deformity, or step-off.  Patient with active range of motion of upper and lower extremities bilaterally. NEURO: Cranial nerves II to XII grossly intact.   ED Results / Procedures / Treatments   Labs (all labs ordered are listed, but only abnormal results are displayed) Labs Reviewed  BASIC METABOLIC PANEL - Abnormal; Notable for the following components:      Result Value   Sodium 134 (*)    Glucose, Bld 104 (*)    All other components within normal limits  CBC - Abnormal; Notable for the following components:   MCHC 36.7 (*)    All other components within normal limits  SARS CORONAVIRUS 2 BY RT PCR  TROPONIN I (HIGH SENSITIVITY)    EKG  Vent. rate 97 BPM PR interval 148 ms QRS duration 76 ms QT/QTcB 300/381 ms P-R-T axes 54 75 50 Normal sinus rhythm T wave abnormality, consider lateral ischemia Abnormal ECG When compared with ECG of 21-Jan-2015 06:54, Vent. rate has increased BY 42 BPM ST no longer elevated in Lateral leads No STEMI  RADIOLOGY  I personally viewed and evaluated these images as part of my  medical decision making, as well as reviewing the written report by the radiologist.  ED Provider Interpretation: no acute findings  No results found.   PROCEDURES:  Critical Care performed: No  Procedures   MEDICATIONS ORDERED IN ED: Medications  sodium chloride 0.9 % bolus 1,000 mL (0 mLs Intravenous Stopped 02/05/23 2054)  metoCLOPramide (REGLAN) injection 10 mg (10 mg Intravenous Given 02/05/23 1951)  morphine (PF) 4 MG/ML injection 4 mg (4 mg Intravenous Given 02/05/23 1951)  diphenhydrAMINE (BENADRYL) injection 25 mg (25 mg Intravenous Given 02/05/23 1951)  acetaminophen (TYLENOL) tablet 1,000 mg (1,000 mg Oral Given 02/05/23 1948)  ketorolac (TORADOL) 30 MG/ML injection 30 mg (30 mg Intravenous Given 02/05/23 2130)  ALPRAZolam (XANAX) tablet 0.5 mg (0.5 mg Oral Given 02/05/23 2130)     IMPRESSION / MDM / ASSESSMENT AND PLAN / ED COURSE  I reviewed the triage  vital signs and the nursing notes.                              Differential diagnosis includes, but is not limited to, ACS, aortic dissection, pulmonary embolism, cardiac tamponade, pneumothorax, pneumonia, pericarditis, myocarditis, GI-related causes including esophagitis/gastritis, and musculoskeletal chest wall pain.    Patient's presentation is most consistent with acute complicated illness / injury requiring diagnostic workup.  Patient's diagnosis is consistent with nonspecific chest pain, bronchitis, and an acute headache syndrome.  Patient with reassuring exam and workup overall.  No indication of ACS, acute intrathoracic process, or lab abnormalities.  Patient with reassuring EKG, without evidence of malignant arrhythmia.  I discussed at length the patient's headache management as well as his ongoing evaluation and management of his neuromuscular/neuropathic pain.  I advised that the patient really start his gabapentin, as it is not a PRN medication.  I also advised the patient on management of his headache with OTC Benadryl and Reglan as discussed.  I will treat the patient for an acute bronchitis given his intermittent cough and documented fever here in the ED.  Patient will be discharged home with prescriptions for azithromycin, Tessalon Perles, cyclobenzaprine, gabapentin, and Reglan. Patient is to follow up with primary provider or specialist as discussed, as needed or otherwise directed. Patient is given ED precautions to return to the ED for any worsening or new symptoms.  FINAL CLINICAL IMPRESSION(S) / ED DIAGNOSES   Final diagnoses:  Acute nonintractable headache, unspecified headache type  Nonspecific chest pain  Bronchitis     Rx / DC Orders   ED Discharge Orders          Ordered    azithromycin (ZITHROMAX Z-PAK) 250 MG tablet        02/05/23 2120    benzonatate (TESSALON PERLES) 100 MG capsule        02/05/23 2120    metoCLOPramide (REGLAN) 10 MG tablet  3 times daily  with meals        02/05/23 2120    cyclobenzaprine (FLEXERIL) 5 MG tablet  3 times daily PRN        02/05/23 2120    gabapentin (NEURONTIN) 300 MG capsule  3 times daily        02/05/23 2120             Note:  This document was prepared using Dragon voice recognition software and may include unintentional dictation errors.    Lissa Hoard, PA-C 02/09/23 1947    Sharyn Creamer, MD 02/11/23 [phone]

## 2023-02-05 NOTE — ED Notes (Signed)
See triage notes. Patient stated he began having severe chest pain today and felt like he was unable to breath.

## 2023-02-09 ENCOUNTER — Telehealth: Payer: Self-pay

## 2023-02-09 NOTE — Transitions of Care (Post Inpatient/ED Visit) (Signed)
   02/09/2023  Name: Shaun Gibbs MRN: [id number] DOB: 1990-08-02  Today's TOC FU Call Status: Today's TOC FU Call Status:: Unsuccessful Call (1st Attempt) Unsuccessful Call (1st Attempt) Date: 02/09/23  Attempted to reach the patient regarding the most recent Inpatient/ED visit.  Follow Up Plan: Additional outreach attempts will be made to reach the patient to complete the Transitions of Care (Post Inpatient/ED visit) call.   Signature  tb,cma

## 2023-02-10 ENCOUNTER — Telehealth: Payer: Self-pay

## 2023-02-10 NOTE — Transitions of Care (Post Inpatient/ED Visit) (Signed)
   02/10/2023  Name: Shaun Gibbs MRN: [id number] DOB: 10-29-1990  Today's TOC FU Call Status: Today's TOC FU Call Status:: Successful TOC FU Call Completed TOC FU Call Complete Date: 02/10/23  Transition Care Management Follow-up Telephone Call Date of Discharge: 02/05/23 Discharge Facility: [facility]) Type of Discharge: Emergency Department How have you been since you were released from the hospital?: Better Any questions or concerns?: No  Items Reviewed: Did you receive and understand the discharge instructions provided?: Yes Medications obtained,verified, and reconciled?: Yes (Medications Reviewed) Any new allergies since your discharge?: No Dietary orders reviewed?: NA Do you have support at home?: Yes People in Home: spouse  Medications Reviewed Today: Medications Reviewed Today   Medications were not reviewed in this encounter     Home Care and Equipment/Supplies: Were Home Health Services Ordered?: No Any new equipment or medical supplies ordered?: No  Functional Questionnaire: Do you need assistance with bathing/showering or dressing?: No Do you need assistance with meal preparation?: No Do you need assistance with eating?: No Do you have difficulty maintaining continence: No Do you need assistance with getting out of bed/getting out of a chair/moving?: No Do you have difficulty managing or taking your medications?: No  Follow up appointments reviewed: PCP Follow-up appointment confirmed?: (S) NA (DECLINED) Specialist Hospital Follow-up appointment confirmed?: NA Do you need transportation to your follow-up appointment?: No Do you understand care options if your condition(s) worsen?: Yes-patient verbalized understanding    SIGNATURE TB,CMA

## 2023-08-11 ENCOUNTER — Ambulatory Visit
Admission: EM | Admit: 2023-08-11 | Discharge: 2023-08-11 | Disposition: A | Discharge status: Home / Self Care | Payer: Commercial Managed Care - PPO | Attending: Family Medicine | Admitting: Family Medicine

## 2023-08-11 DIAGNOSIS — J101 Influenza due to other identified influenza virus with other respiratory manifestations: Secondary | ICD-10-CM | POA: Diagnosis not present

## 2023-08-11 LAB — RESP PANEL BY RT-PCR (RSV, FLU A&B, COVID)  RVPGX2
Influenza A by PCR: POSITIVE — AB
Influenza B by PCR: NEGATIVE
Resp Syncytial Virus by PCR: NEGATIVE
SARS Coronavirus 2 by RT PCR: NEGATIVE

## 2023-08-11 MED ORDER — OSELTAMIVIR PHOSPHATE 75 MG PO CAPS: 75.0000 mg | ORAL_CAPSULE | Freq: Two times a day (BID) | ORAL | 0 refills | Status: AC

## 2023-08-11 NOTE — ED Triage Notes (Signed)
 Pt c/o bodyaches & chills x1 day. Denies any fevers.

## 2023-08-11 NOTE — ED Provider Notes (Addendum)
 [facility]    CSN: [id number] Arrival date & time: 08/11/23  0802      History   Chief Complaint Chief Complaint  Patient presents with   Generalized Body Aches   Chills    HPI Shaun Gibbs is a 33 y.o. male.   HPI  History obtained from the patient. Shaun Gibbs presents for body aches, chills, cough, sore throat, headache, rhinorrhea, nasal congestion and ear congestion that started yesterday afternoon around 5 PM.  He has 4 boys under the age of 3 at home. His wife has been sick too. Taking Mucinex this morning.    He has asthma. Has been wheezing but is not shortness of breath.     Past Medical History:  Diagnosis Date   Asthma    Neuromuscular disorder (HCC)     There are no active problems to display for this patient.   Past Surgical History:  Procedure Laterality Date   BICEPS TENDON REPAIR Right    nose fracture repair     SHOULDER SURGERY Right    thumb tendon     repair   TONSILLECTOMY         Home Medications    Prior to Admission medications   Medication Sig Start Date End Date Taking? Authorizing Provider  DULoxetine (CYMBALTA) 60 MG capsule SMARTSIG:Capsule(s) By Mouth   Yes [provider]  omeprazole (PRILOSEC) 40 MG capsule Take 1 capsule by mouth daily. 06/11/23 06/10/24 Yes [provider]  oseltamivir  (TAMIFLU ) 75 MG capsule Take 1 capsule (75 mg total) by mouth every 12 (twelve) hours. 08/11/23  Yes Luismario Coston, DO  pregabalin (LYRICA) 75 MG capsule Take 75 mg by mouth 3 (three) times daily.   Yes [provider]  propranolol (INDERAL) 10 MG tablet Take 1 tablet (10 mg total) by mouth two (2) times a day as needed (anxiety). 05/27/23 05/26/24 Yes [provider]  tiZANidine (ZANAFLEX) 4 MG tablet Take 1 tablet (4 mg total) by mouth every six (6) hours as needed. 06/16/23  Yes [provider]  benzonatate  (TESSALON  PERLES) 100 MG capsule Take 1-2 tabs TID prn cough 02/05/23    Menshew, Candida LULLA Kings, PA-C  cyclobenzaprine  (FLEXERIL ) 5 MG tablet Take 1 tablet (5 mg total) by mouth 3 (three) times daily as needed. 02/05/23   Menshew, Candida LULLA Kings, PA-C  gabapentin  (NEURONTIN ) 300 MG capsule Take 1 capsule (300 mg total) by mouth 3 (three) times daily. 02/05/23 05/06/23  Menshew, Candida LULLA Kings, PA-C  metoCLOPramide  (REGLAN ) 10 MG tablet Take 1 tablet (10 mg total) by mouth 3 (three) times daily with meals for 10 days. 02/05/23 02/15/23  Menshew, Candida LULLA Kings, PA-C  omeprazole (PRILOSEC) 40 MG capsule Take 20 mg by mouth daily. otc 04/27/22   [provider]    Family History Family History  Problem Relation Age of Onset   Hypertension Mother    Healthy Mother    Healthy Father    Heart disease Paternal Grandfather     Social History Social History   Tobacco Use   Smoking status: Never   Smokeless tobacco: Current    Types: Snuff  Vaping Use   Vaping status: Never Used  Substance Use Topics   Alcohol use: Not Currently   Drug use: No     Allergies   Aspirin, Duloxetine, Prednisone, and Tylenol  [acetaminophen ]   Review of Systems Review of Systems: negative unless otherwise stated in HPI.      Physical Exam Triage  Vital Signs ED Triage Vitals  Encounter Vitals Group     BP 08/11/23 0817 117/80     Systolic BP Percentile --      Diastolic BP Percentile --      Pulse Rate 08/11/23 0817 82     Resp 08/11/23 0817 16     Temp 08/11/23 0817 99.1 F (37.3 C)     Temp Source 08/11/23 0817 Oral     SpO2 08/11/23 0817 96 %     Weight 08/11/23 0815 190 lb (86.2 kg)     Height 08/11/23 0815 6' 2 (1.88 m)     Head Circumference --      Peak Flow --      Pain Score 08/11/23 0821 8     Pain Loc --      Pain Education --      Exclude from Growth Chart --    No data found.  Updated Vital Signs BP 117/80 (BP Location: Right Arm)   Pulse 82   Temp 99.1 F (37.3 C) (Oral)   Resp 16   Ht 6' 2 (1.88 m)   Wt 86.2 kg   SpO2 96%   BMI  24.39 kg/m   Visual Acuity Right Eye Distance:   Left Eye Distance:   Bilateral Distance:    Right Eye Near:   Left Eye Near:    Bilateral Near:     Physical Exam GEN:     alert, ill but non-toxic appearing male in no distress    HENT:  mucus membranes moist, oropharyngeal without lesions or erythema, no tonsillar hypertrophy or exudates, clear nasal discharge, bilateral TM scarring but no erythema or bulging EYES:   no scleral injection or discharge NECK:  normal ROM, no meningismus   RESP:  no increased work of breathing, clear to auscultation bilaterally CVS:   regular rate and rhythm Skin:   warm and dry    UC Treatments / Results  Labs (all labs ordered are listed, but only abnormal results are displayed) Labs Reviewed  RESP PANEL BY RT-PCR (RSV, FLU A&B, COVID)  RVPGX2 - Abnormal; Notable for the following components:      Result Value   Influenza A by PCR POSITIVE (*)    All other components within normal limits    EKG   Radiology No results found.  Procedures Procedures (including critical care time)  Medications Ordered in UC Medications - No data to display  Initial Impression / Assessment and Plan / UC Course  I have reviewed the triage vital signs and the nursing notes.  Pertinent labs & imaging results that were available during my care of the patient were reviewed by me and considered in my medical decision making (see chart for details).       Pt is a 33 y.o. male who has asthma presents for 1 day of influenza-like symptoms. Shaun Gibbs is afebrile here though had recent antipyretics. Satting well on room air. Overall pt is non-toxic appearing, well hydrated, without respiratory distress. Pulmonary exam is unremarkable.  COVID, RSV and influenza panel obtained and was influenza A positive. Tamiflu  prescribed. Discussed symptomatic treatment. Typical duration of symptoms discussed.  Work note declined.   Return and ED precautions given and voiced  understanding. Discussed MDM, treatment plan and plan for follow-up with patient who agrees with plan.     Final Clinical Impressions(s) / UC Diagnoses   Final diagnoses:  Influenza A with respiratory manifestations     Discharge Instructions  You have influenza A.  Tamiflu  was prescribed.  Your symptoms will gradually improve over the next 7 to 10 days.  The cough may last about 3 weeks.   Take ibuprofen  600 mg with Tylenol  1000 mg for fever, headache or body aches.   For cough: You can also use guaifenesin and dextromethorphan for cough. You can use a humidifier for chest congestion and cough.  If you don't have a humidifier, you can sit in the bathroom with the hot shower running.      For sore throat: try warm salt water gargles, Mucinex sore throat cough drops or cepacol lozenges, throat spray, warm tea or water with lemon/honey, popsicles or ice, or OTC cold relief medicine for throat discomfort. You can also purchase chloraseptic spray at the pharmacy or dollar store.   For congestion: take a daily anti-histamine like Zyrtec, Claritin, and a oral decongestant, such as pseudoephedrine.  You can also use Flonase  1-2 sprays in each nostril daily. Afrin is also a good option, if you do not have high blood pressure.    It is important to stay hydrated: drink plenty of fluids (water, gatorade/powerade/pedialyte, juices, or teas) to keep your throat moisturized and help further relieve irritation/discomfort.    Return or go to the Emergency Department if symptoms worsen or do not improve in the next few days      ED Prescriptions     Medication Sig Dispense Auth. Provider   oseltamivir  (TAMIFLU ) 75 MG capsule Take 1 capsule (75 mg total) by mouth every 12 (twelve) hours. 10 capsule Sharesa Kemp, DO      PDMP not reviewed this encounter.       Jorja Empie, DO 08/11/23 [phone]

## 2023-08-11 NOTE — Discharge Instructions (Addendum)
 You have influenza A.  Tamiflu wasprescribed.  Your symptoms will gradually improve over the next 7 to 10 days.  The cough may last about 3 weeks.   Take ibuprofen 600 mg with Tylenol 1000 mg for fever, headache or body aches.   For cough: You can also use guaifenesin and dextromethorphan for cough. You can use a humidifier for chest congestion and cough.  If you don't have a humidifier, you can sit in the bathroom with the hot shower running.      For sore throat: try warm salt water gargles, Mucinex sore throat cough drops or cepacol lozenges, throat spray, warm tea or water with lemon/honey, popsicles or ice, or OTC cold relief medicine for throat discomfort. You can also purchase chloraseptic spray at the pharmacy or dollar store.   For congestion: take a daily anti-histamine like Zyrtec, Claritin, and a oral decongestant, such as pseudoephedrine.  You can also use Flonase 1-2 sprays in each nostril daily. Afrin is also a good option, if you do not have high blood pressure.    It is important to stay hydrated: drink plenty of fluids (water, gatorade/powerade/pedialyte, juices, or teas) to keep your throat moisturized and help further relieve irritation/discomfort.    Return or go to the Emergency Department if symptoms worsen or do not improve in the next few days
